# Patient Record
Sex: Male | Born: 1960 | Race: White | Hispanic: No | Smoking: Current every day smoker
Health system: Southern US, Community
[De-identification: ages and names within clinical notes are randomized; demographics above are authoritative.]

## PROBLEM LIST (undated history)

## (undated) DIAGNOSIS — J449 Chronic obstructive pulmonary disease, unspecified: Secondary | ICD-10-CM

## (undated) DIAGNOSIS — J189 Pneumonia, unspecified organism: Secondary | ICD-10-CM

---

## 1998-02-06 ENCOUNTER — Emergency Department (HOSPITAL_COMMUNITY): Admission: EM | Admit: 1998-02-06 | Discharge: 1998-02-06 | Payer: Self-pay | Admitting: Emergency Medicine

## 1999-01-14 ENCOUNTER — Emergency Department (HOSPITAL_COMMUNITY): Admission: EM | Admit: 1999-01-14 | Discharge: 1999-01-14 | Payer: Self-pay | Admitting: Emergency Medicine

## 2013-06-13 ENCOUNTER — Encounter (HOSPITAL_COMMUNITY): Payer: Self-pay | Admitting: Emergency Medicine

## 2013-06-13 ENCOUNTER — Emergency Department (INDEPENDENT_AMBULATORY_CARE_PROVIDER_SITE_OTHER): Payer: BC Managed Care – PPO

## 2013-06-13 ENCOUNTER — Emergency Department (INDEPENDENT_AMBULATORY_CARE_PROVIDER_SITE_OTHER)
Admission: EM | Admit: 2013-06-13 | Discharge: 2013-06-13 | Disposition: A | Discharge status: Home / Self Care | Payer: BC Managed Care – PPO | Source: Home / Self Care | Attending: Family Medicine | Admitting: Family Medicine

## 2013-06-13 DIAGNOSIS — J111 Influenza due to unidentified influenza virus with other respiratory manifestations: Secondary | ICD-10-CM

## 2013-06-13 HISTORY — DX: Chronic obstructive pulmonary disease, unspecified: J44.9

## 2013-06-13 HISTORY — DX: Pneumonia, unspecified organism: J18.9

## 2013-06-13 LAB — POCT I-STAT, CHEM 8
Calcium, Ion: 1.18 mmol/L (ref 1.12–1.23)
Chloride: 98 mEq/L (ref 96–112)
Glucose, Bld: 110 mg/dL — ABNORMAL HIGH (ref 70–99)
HCT: 48 % (ref 39.0–52.0)
Hemoglobin: 16.3 g/dL (ref 13.0–17.0)
TCO2: 28 mmol/L (ref 0–100)

## 2013-06-13 LAB — CBC
MCH: 31.2 pg (ref 26.0–34.0)
MCHC: 35.7 g/dL (ref 30.0–36.0)
Platelets: 189 10*3/uL (ref 150–400)
RBC: 5.23 MIL/uL (ref 4.22–5.81)
RDW: 12.9 % (ref 11.5–15.5)

## 2013-06-13 LAB — SEDIMENTATION RATE: Sed Rate: 3 mm/hr (ref 0–16)

## 2013-06-13 MED ORDER — IBUPROFEN 800 MG PO TABS
ORAL_TABLET | ORAL | Status: AC
  Filled 2013-06-13: qty 1

## 2013-06-13 MED ORDER — ONDANSETRON HCL 8 MG PO TABS: 8.0000 mg | ORAL_TABLET | Freq: Three times a day (TID) | ORAL | Status: DC | PRN

## 2013-06-13 MED ORDER — AZITHROMYCIN 250 MG PO TABS: 250.0000 mg | ORAL_TABLET | Freq: Every day | ORAL | Status: DC

## 2013-06-13 MED ORDER — IBUPROFEN 800 MG PO TABS
800.0000 mg | ORAL_TABLET | Freq: Once | ORAL | Status: AC
  Administered 2013-06-13: 800 mg via ORAL

## 2013-06-13 MED ORDER — ONDANSETRON 4 MG PO TBDP
ORAL_TABLET | ORAL | Status: AC
  Filled 2013-06-13: qty 2

## 2013-06-13 MED ORDER — ONDANSETRON 4 MG PO TBDP
8.0000 mg | ORAL_TABLET | Freq: Once | ORAL | Status: AC
  Administered 2013-06-13: 8 mg via ORAL

## 2013-06-13 MED ORDER — HYDROCODONE-ACETAMINOPHEN 5-325 MG PO TABS: 1.0000 | ORAL_TABLET | Freq: Four times a day (QID) | ORAL | Status: DC | PRN

## 2013-06-13 NOTE — ED Notes (Signed)
Started this morning with fever 101.5, swollen & painful bilat hands, knee pain, generalized aches, diarrhea, nausea, and slight HA.  Since pneumonia in Sept, has had "wet" cough, but yesterday cough completely went away.  Had very slight sore throat when woke up from sleeping today.  Has taken Advil - last dose @ 1400.

## 2013-06-13 NOTE — ED Provider Notes (Signed)
Clayton Wright is a 52 y.o. male who presents to Urgent Care today for cough congestion diarrhea body aches headache. This is been present for the last 2 days. However he developed a fever recently and has had bilateral hand pain. His hand pain is the most concerning symptom for the patient. He notes that with the diarrhea he has been urinating less today than normal. He has been trying to eat and drink normally. He denies any chest pain or trouble breathing.  Hand pain as moderate. He has tried over-the-counter pain medications which are been mildly helpful.   Past Medical History  Diagnosis Date  . COPD (chronic obstructive pulmonary disease)   . Pneumonia     02/2013   History  Substance Use Topics  . Smoking status: Current Every Day Smoker -- 1.00 packs/day    Types: Cigarettes  . Smokeless tobacco: Not on file  . Alcohol Use: No   ROS as above Medications reviewed. No current facility-administered medications for this encounter.   Current Outpatient Prescriptions  Medication Sig Dispense Refill  . azithromycin (ZITHROMAX) 250 MG tablet Take 1 tablet (250 mg total) by mouth daily. Take first 2 tablets together, then 1 every day until finished.  6 tablet  0  . HYDROcodone-acetaminophen (NORCO/VICODIN) 5-325 MG per tablet Take 1 tablet by mouth every 6 (six) hours as needed.  15 tablet  0  . ondansetron (ZOFRAN) 8 MG tablet Take 1 tablet (8 mg total) by mouth every 8 (eight) hours as needed for nausea or vomiting.  20 tablet  0    Exam:  BP 124/83  Pulse 117  Temp(Src) 101 F (38.3 C) (Oral)  Resp 20  SpO2 96% Gen: Well NAD HEENT: EOMI,  MMM Lungs: Normal work of breathing. Decreased breath sounds right lower lobe. Normal on left. Heart: RRR no MRG Abd: NABS, Soft. NT, ND Exts: Non edematous BL  LE, warm and well perfused.  Hands: Mildly tender MCPs bilaterally. No significant boggy synovitis palpated.  Results for orders placed during the hospital encounter of 06/13/13  (from the past 24 hour(s))  CBC     Status: Abnormal   Collection Time    06/13/13  6:47 PM      Result Value Range   WBC 14.7 (*) 4.0 - 10.5 K/uL   RBC 5.23  4.22 - 5.81 MIL/uL   Hemoglobin 16.3  13.0 - 17.0 g/dL   HCT 16.1  09.6 - 04.5 %   MCV 87.2  78.0 - 100.0 fL   MCH 31.2  26.0 - 34.0 pg   MCHC 35.7  30.0 - 36.0 g/dL   RDW 40.9  81.1 - 91.4 %   Platelets 189  150 - 400 K/uL  POCT I-STAT, CHEM 8     Status: Abnormal   Collection Time    06/13/13  7:11 PM      Result Value Range   Sodium 137  135 - 145 mEq/L   Potassium 3.8  3.5 - 5.1 mEq/L   Chloride 98  96 - 112 mEq/L   BUN 9  6 - 23 mg/dL   Creatinine, Ser 7.82  0.50 - 1.35 mg/dL   Glucose, Bld 956 (*) 70 - 99 mg/dL   Calcium, Ion 2.13  0.86 - 1.23 mmol/L   TCO2 28  0 - 100 mmol/L   Hemoglobin 16.3  13.0 - 17.0 g/dL   HCT 57.8  46.9 - 62.9 %   Dg Chest 2 View  06/13/2013  CLINICAL DATA:  Fever, body aches, dizziness  EXAM: CHEST - 2 VIEW  COMPARISON:  None available  FINDINGS: Lungs are clear. Heart size and mediastinal contours are within normal limits. Tortuous thoracic aorta. No effusion. Visualized skeletal structures are unremarkable.  IMPRESSION: No acute cardiopulmonary disease.   Electronically Signed   By: Oley Balm M.D.   On: 06/13/2013 19:20    Assessment and Plan: 52 y.o. male with probable influenza. Patient has fever myalgia and arthralgia. Additionally he has a slight headache. His chest x-ray is normal as are his labs with the exception of an elevated white count at 14.7. We discussed options. I offered transfer to the emergency room tonight for further evaluation and management and the patient declined. I do not feel that this is essential at this time. I feel that he is safe for watchful waiting tonight with oral hydration protocol, and small amount of Norco. Additionally we'll prescribe azithromycin for use if he's not improving. I recommend that he followup with his primary care provider on  Monday and or present to the emergency room if worsening. Discussed warning signs or symptoms. Please see discharge instructions. Patient expresses understanding.      Rodolph Bong, MD 06/13/13 [phone]

## 2020-08-29 ENCOUNTER — Emergency Department (HOSPITAL_COMMUNITY): Payer: BC Managed Care – PPO

## 2020-08-29 ENCOUNTER — Emergency Department (HOSPITAL_COMMUNITY): Payer: BC Managed Care – PPO | Admitting: Certified Registered Nurse Anesthetist

## 2020-08-29 ENCOUNTER — Encounter (HOSPITAL_COMMUNITY): Admission: EM | Disposition: E | Payer: Self-pay | Source: Home / Self Care | Attending: Vascular Surgery

## 2020-08-29 ENCOUNTER — Inpatient Hospital Stay (HOSPITAL_COMMUNITY)
Admission: EM | Admit: 2020-08-29 | Discharge: 2020-09-22 | DRG: 270 | Disposition: E | Payer: BC Managed Care – PPO | Attending: Vascular Surgery | Admitting: Vascular Surgery

## 2020-08-29 ENCOUNTER — Inpatient Hospital Stay (HOSPITAL_COMMUNITY): Payer: BC Managed Care – PPO

## 2020-08-29 DIAGNOSIS — D62 Acute posthemorrhagic anemia: Secondary | ICD-10-CM | POA: Diagnosis present

## 2020-08-29 DIAGNOSIS — J449 Chronic obstructive pulmonary disease, unspecified: Secondary | ICD-10-CM | POA: Diagnosis present

## 2020-08-29 DIAGNOSIS — R571 Hypovolemic shock: Secondary | ICD-10-CM | POA: Diagnosis present

## 2020-08-29 DIAGNOSIS — Z91030 Bee allergy status: Secondary | ICD-10-CM | POA: Diagnosis not present

## 2020-08-29 DIAGNOSIS — Z20822 Contact with and (suspected) exposure to covid-19: Secondary | ICD-10-CM | POA: Diagnosis present

## 2020-08-29 DIAGNOSIS — N179 Acute kidney failure, unspecified: Secondary | ICD-10-CM | POA: Diagnosis not present

## 2020-08-29 DIAGNOSIS — F17210 Nicotine dependence, cigarettes, uncomplicated: Secondary | ICD-10-CM | POA: Diagnosis present

## 2020-08-29 DIAGNOSIS — I713 Abdominal aortic aneurysm, ruptured, unspecified: Secondary | ICD-10-CM

## 2020-08-29 DIAGNOSIS — Z79899 Other long term (current) drug therapy: Secondary | ICD-10-CM | POA: Diagnosis not present

## 2020-08-29 DIAGNOSIS — Z515 Encounter for palliative care: Secondary | ICD-10-CM | POA: Diagnosis not present

## 2020-08-29 DIAGNOSIS — D72829 Elevated white blood cell count, unspecified: Secondary | ICD-10-CM

## 2020-08-29 DIAGNOSIS — R109 Unspecified abdominal pain: Secondary | ICD-10-CM | POA: Diagnosis present

## 2020-08-29 DIAGNOSIS — Z792 Long term (current) use of antibiotics: Secondary | ICD-10-CM

## 2020-08-29 DIAGNOSIS — E876 Hypokalemia: Secondary | ICD-10-CM

## 2020-08-29 DIAGNOSIS — I469 Cardiac arrest, cause unspecified: Secondary | ICD-10-CM | POA: Diagnosis not present

## 2020-08-29 DIAGNOSIS — J9600 Acute respiratory failure, unspecified whether with hypoxia or hypercapnia: Secondary | ICD-10-CM | POA: Diagnosis not present

## 2020-08-29 DIAGNOSIS — N2883 Nephroptosis: Secondary | ICD-10-CM | POA: Diagnosis not present

## 2020-08-29 DIAGNOSIS — R578 Other shock: Secondary | ICD-10-CM | POA: Diagnosis present

## 2020-08-29 DIAGNOSIS — Z419 Encounter for procedure for purposes other than remedying health state, unspecified: Secondary | ICD-10-CM

## 2020-08-29 DIAGNOSIS — Z978 Presence of other specified devices: Secondary | ICD-10-CM

## 2020-08-29 DIAGNOSIS — Z880 Allergy status to penicillin: Secondary | ICD-10-CM | POA: Diagnosis not present

## 2020-08-29 DIAGNOSIS — Z66 Do not resuscitate: Secondary | ICD-10-CM | POA: Diagnosis not present

## 2020-08-29 DIAGNOSIS — E872 Acidosis, unspecified: Secondary | ICD-10-CM | POA: Diagnosis present

## 2020-08-29 DIAGNOSIS — R739 Hyperglycemia, unspecified: Secondary | ICD-10-CM | POA: Diagnosis present

## 2020-08-29 DIAGNOSIS — Z6836 Body mass index (BMI) 36.0-36.9, adult: Secondary | ICD-10-CM | POA: Diagnosis not present

## 2020-08-29 DIAGNOSIS — I468 Cardiac arrest due to other underlying condition: Secondary | ICD-10-CM | POA: Diagnosis not present

## 2020-08-29 DIAGNOSIS — K661 Hemoperitoneum: Secondary | ICD-10-CM | POA: Diagnosis not present

## 2020-08-29 DIAGNOSIS — D65 Disseminated intravascular coagulation [defibrination syndrome]: Secondary | ICD-10-CM | POA: Diagnosis present

## 2020-08-29 HISTORY — PX: ABDOMINAL AORTIC ANEURYSM REPAIR: SHX42

## 2020-08-29 LAB — TYPE AND SCREEN: Unit division: 0

## 2020-08-29 LAB — BPAM RBC
Blood Product Expiration Date: 202204032359
Blood Product Expiration Date: 202204042359
Blood Product Expiration Date: 202204052359
Blood Product Expiration Date: 202204052359
ISSUE DATE / TIME: 202203081753
ISSUE DATE / TIME: 202203081858
Unit Type and Rh: 5100
Unit Type and Rh: 5100
Unit Type and Rh: 5100

## 2020-08-29 LAB — POCT I-STAT 7, (LYTES, BLD GAS, ICA,H+H)
Acid-base deficit: 10 mmol/L — ABNORMAL HIGH (ref 0.0–2.0)
Bicarbonate: 18.8 mmol/L — ABNORMAL LOW (ref 20.0–28.0)
Calcium, Ion: 1.21 mmol/L (ref 1.15–1.40)
HCT: 26 % — ABNORMAL LOW (ref 39.0–52.0)
Hemoglobin: 8.8 g/dL — ABNORMAL LOW (ref 13.0–17.0)
O2 Saturation: 100 %
Patient temperature: 34.6
Potassium: 5 mmol/L (ref 3.5–5.1)
Sodium: 137 mmol/L (ref 135–145)
TCO2: 21 mmol/L — ABNORMAL LOW (ref 22–32)
pCO2 arterial: 53.3 mmHg — ABNORMAL HIGH (ref 32.0–48.0)
pH, Arterial: 7.141 — CL (ref 7.350–7.450)
pO2, Arterial: 274 mmHg — ABNORMAL HIGH (ref 83.0–108.0)

## 2020-08-29 LAB — BASIC METABOLIC PANEL
Anion gap: 16 — ABNORMAL HIGH (ref 5–15)
BUN: 13 mg/dL (ref 6–20)
CO2: 15 mmol/L — ABNORMAL LOW (ref 22–32)
Calcium: 8.5 mg/dL — ABNORMAL LOW (ref 8.9–10.3)
Chloride: 105 mmol/L (ref 98–111)
Creatinine, Ser: 2.12 mg/dL — ABNORMAL HIGH (ref 0.61–1.24)
GFR, Estimated: 35 mL/min — ABNORMAL LOW (ref >60)
Glucose, Bld: 350 mg/dL — ABNORMAL HIGH (ref 70–99)
Potassium: 5.1 mmol/L (ref 3.5–5.1)
Sodium: 136 mmol/L (ref 135–145)

## 2020-08-29 LAB — CBC
HCT: 38.8 % — ABNORMAL LOW (ref 39.0–52.0)
HCT: 29.8 % — ABNORMAL LOW (ref 39.0–52.0)
Hemoglobin: 12.5 g/dL — ABNORMAL LOW (ref 13.0–17.0)
Hemoglobin: 9.6 g/dL — ABNORMAL LOW (ref 13.0–17.0)
MCH: 30.8 pg (ref 26.0–34.0)
MCH: 29.7 pg (ref 26.0–34.0)
MCHC: 32.2 g/dL (ref 30.0–36.0)
MCHC: 32.2 g/dL (ref 30.0–36.0)
MCV: 95.6 fL (ref 80.0–100.0)
MCV: 92.3 fL (ref 80.0–100.0)
Platelets: 188 10*3/uL (ref 150–400)
Platelets: 32 10*3/uL — ABNORMAL LOW (ref 150–400)
RBC: 4.06 MIL/uL — ABNORMAL LOW (ref 4.22–5.81)
RBC: 3.23 MIL/uL — ABNORMAL LOW (ref 4.22–5.81)
RDW: 13.1 % (ref 11.5–15.5)
RDW: 15.2 % (ref 11.5–15.5)
WBC: 17.9 10*3/uL — ABNORMAL HIGH (ref 4.0–10.5)
WBC: 24.1 10*3/uL — ABNORMAL HIGH (ref 4.0–10.5)
nRBC: 0 % (ref 0.0–0.2)
nRBC: 0.2 % (ref 0.0–0.2)

## 2020-08-29 LAB — I-STAT CHEM 8, ED
BUN: 13 mg/dL (ref 6–20)
Calcium, Ion: 1.07 mmol/L — ABNORMAL LOW (ref 1.15–1.40)
Chloride: 103 mmol/L (ref 98–111)
Creatinine, Ser: 1.6 mg/dL — ABNORMAL HIGH (ref 0.61–1.24)
Glucose, Bld: 206 mg/dL — ABNORMAL HIGH (ref 70–99)
HCT: 35 % — ABNORMAL LOW (ref 39.0–52.0)
Hemoglobin: 11.9 g/dL — ABNORMAL LOW (ref 13.0–17.0)
Potassium: 3 mmol/L — ABNORMAL LOW (ref 3.5–5.1)
Sodium: 139 mmol/L (ref 135–145)
TCO2: 17 mmol/L — ABNORMAL LOW (ref 22–32)

## 2020-08-29 LAB — COMPREHENSIVE METABOLIC PANEL
ALT: 19 U/L (ref 0–44)
AST: 25 U/L (ref 15–41)
Albumin: 3.6 g/dL (ref 3.5–5.0)
Alkaline Phosphatase: 55 U/L (ref 38–126)
Anion gap: 21 — ABNORMAL HIGH (ref 5–15)
BUN: 12 mg/dL (ref 6–20)
CO2: 15 mmol/L — ABNORMAL LOW (ref 22–32)
Calcium: 9.2 mg/dL (ref 8.9–10.3)
Chloride: 100 mmol/L (ref 98–111)
Creatinine, Ser: 1.73 mg/dL — ABNORMAL HIGH (ref 0.61–1.24)
GFR, Estimated: 45 mL/min — ABNORMAL LOW (ref >60)
Glucose, Bld: 224 mg/dL — ABNORMAL HIGH (ref 70–99)
Potassium: 2.9 mmol/L — ABNORMAL LOW (ref 3.5–5.1)
Sodium: 136 mmol/L (ref 135–145)
Total Bilirubin: 0.9 mg/dL (ref 0.3–1.2)
Total Protein: 6.2 g/dL — ABNORMAL LOW (ref 6.5–8.1)

## 2020-08-29 LAB — MAGNESIUM: Magnesium: 2.4 mg/dL (ref 1.7–2.4)

## 2020-08-29 LAB — SARS CORONAVIRUS 2 BY RT PCR (HOSPITAL ORDER, PERFORMED IN ~~LOC~~ HOSPITAL LAB): SARS Coronavirus 2: NEGATIVE

## 2020-08-29 LAB — MASSIVE TRANSFUSION PROTOCOL ORDER (BLOOD BANK NOTIFICATION)

## 2020-08-29 LAB — PROTIME-INR
INR: 1.2 (ref 0.8–1.2)
Prothrombin Time: 15 seconds (ref 11.4–15.2)

## 2020-08-29 LAB — APTT: aPTT: 34 seconds (ref 24–36)

## 2020-08-29 LAB — ABO/RH: ABO/RH(D): O POS

## 2020-08-29 LAB — LACTIC ACID, PLASMA: Lactic Acid, Venous: >11 mmol/L (ref 0.5–1.9)

## 2020-08-29 LAB — PREPARE RBC (CROSSMATCH)

## 2020-08-29 SURGERY — ANEURYSM ABDOMINAL AORTIC REPAIR
Anesthesia: General | Site: Abdomen

## 2020-08-29 MED ORDER — PROPOFOL 1000 MG/100ML IV EMUL
INTRAVENOUS | Status: AC
  Filled 2020-08-29: qty 100

## 2020-08-29 MED ORDER — METOPROLOL TARTRATE 5 MG/5ML IV SOLN: 2.0000 mg | INTRAVENOUS | Status: DC | PRN

## 2020-08-29 MED ORDER — EPINEPHRINE 1 MG/10ML IJ SOSY
PREFILLED_SYRINGE | INTRAMUSCULAR | Status: DC | PRN
  Administered 2020-08-29: 1 mg via INTRAVENOUS

## 2020-08-29 MED ORDER — ALBUMIN HUMAN 5 % IV SOLN: INTRAVENOUS | Status: DC | PRN

## 2020-08-29 MED ORDER — CALCIUM CHLORIDE 10 % IV SOLN
INTRAVENOUS | Status: DC | PRN
  Administered 2020-08-29 (×3): 1 g via INTRAVENOUS

## 2020-08-29 MED ORDER — PROPOFOL 1000 MG/100ML IV EMUL
5.0000 ug/kg/min | INTRAVENOUS | Status: DC
  Administered 2020-08-29: 20 ug/kg/min via INTRAVENOUS

## 2020-08-29 MED ORDER — PANTOPRAZOLE SODIUM 40 MG IV SOLR
40.0000 mg | Freq: Every day | INTRAVENOUS | Status: DC
  Administered 2020-08-30: 40 mg via INTRAVENOUS
  Filled 2020-08-29: qty 40

## 2020-08-29 MED ORDER — MIDAZOLAM HCL 2 MG/2ML IJ SOLN
INTRAMUSCULAR | Status: AC
  Filled 2020-08-29: qty 2

## 2020-08-29 MED ORDER — POLYETHYLENE GLYCOL 3350 17 G PO PACK: 17.0000 g | PACK | Freq: Every day | ORAL | Status: DC

## 2020-08-29 MED ORDER — HYDROMORPHONE HCL 1 MG/ML IJ SOLN
0.5000 mg | INTRAMUSCULAR | Status: DC | PRN
Start: 2020-08-29 — End: 2020-08-29

## 2020-08-29 MED ORDER — NOREPINEPHRINE 4 MG/250ML-% IV SOLN
INTRAVENOUS | Status: AC
  Administered 2020-08-30: 45 mg
  Filled 2020-08-29: qty 250

## 2020-08-29 MED ORDER — INSULIN ASPART 100 UNIT/ML ~~LOC~~ SOLN
SUBCUTANEOUS | Status: AC
  Filled 2020-08-29: qty 1

## 2020-08-29 MED ORDER — SODIUM BICARBONATE 8.4 % IV SOLN: 50.0000 meq | Freq: Once | INTRAVENOUS | Status: AC

## 2020-08-29 MED ORDER — 0.9 % SODIUM CHLORIDE (POUR BTL) OPTIME
TOPICAL | Status: DC | PRN
  Administered 2020-08-29: 2000 mL

## 2020-08-29 MED ORDER — FENTANYL CITRATE (PF) 100 MCG/2ML IJ SOLN
50.0000 ug | INTRAMUSCULAR | Status: DC | PRN
  Administered 2020-08-30 (×6): 100 ug via INTRAVENOUS
  Filled 2020-08-29 (×7): qty 2

## 2020-08-29 MED ORDER — SODIUM CHLORIDE 0.9 % IV SOLN: INTRAVENOUS | Status: DC | PRN

## 2020-08-29 MED ORDER — LORAZEPAM 2 MG/ML IJ SOLN
INTRAMUSCULAR | Status: AC
  Filled 2020-08-29: qty 1

## 2020-08-29 MED ORDER — LORAZEPAM 2 MG/ML IJ SOLN
2.0000 mg | Freq: Once | INTRAMUSCULAR | Status: AC
  Administered 2020-08-29: 2 mg via INTRAVENOUS

## 2020-08-29 MED ORDER — IOHEXOL 350 MG/ML SOLN
100.0000 mL | Freq: Once | INTRAVENOUS | Status: AC | PRN
  Administered 2020-08-29: 100 mL via INTRAVENOUS

## 2020-08-29 MED ORDER — MAGNESIUM SULFATE 2 GM/50ML IV SOLN: 2.0000 g | Freq: Once | INTRAVENOUS | Status: DC | PRN

## 2020-08-29 MED ORDER — SUCCINYLCHOLINE CHLORIDE 20 MG/ML IJ SOLN
INTRAMUSCULAR | Status: AC | PRN
  Administered 2020-08-29: 150 mg via INTRAVENOUS

## 2020-08-29 MED ORDER — FENTANYL CITRATE (PF) 250 MCG/5ML IJ SOLN
INTRAMUSCULAR | Status: DC | PRN
  Administered 2020-08-29 (×2): 100 ug via INTRAVENOUS

## 2020-08-29 MED ORDER — ALBUTEROL SULFATE HFA 108 (90 BASE) MCG/ACT IN AERS
INHALATION_SPRAY | RESPIRATORY_TRACT | Status: DC | PRN
  Administered 2020-08-29 (×2): 2 via RESPIRATORY_TRACT

## 2020-08-29 MED ORDER — VASOPRESSIN 20 UNITS/100 ML INFUSION FOR SHOCK
0.0000 [IU]/min | INTRAVENOUS | Status: DC
  Administered 2020-08-29: .03 [IU]/min via INTRAVENOUS
  Filled 2020-08-29: qty 100

## 2020-08-29 MED ORDER — VASOPRESSIN 20 UNITS/100 ML INFUSION FOR SHOCK
0.0000 [IU]/min | INTRAVENOUS | Status: DC
Start: 2020-08-30 — End: 2020-08-30
  Administered 2020-08-30: 0.05 [IU]/min via INTRAVENOUS
  Administered 2020-08-30: 0.04 [IU]/min via INTRAVENOUS
  Filled 2020-08-29 (×2): qty 100

## 2020-08-29 MED ORDER — VASOPRESSIN 20 UNIT/ML IV SOLN
INTRAVENOUS | Status: AC
  Filled 2020-08-29: qty 1

## 2020-08-29 MED ORDER — ETOMIDATE 2 MG/ML IV SOLN
INTRAVENOUS | Status: AC | PRN
  Administered 2020-08-29: 30 mg via INTRAVENOUS

## 2020-08-29 MED ORDER — VASOPRESSIN 20 UNIT/ML IV SOLN
INTRAVENOUS | Status: DC | PRN
  Administered 2020-08-29 (×7): 2 [IU] via INTRAVENOUS

## 2020-08-29 MED ORDER — FENTANYL CITRATE (PF) 250 MCG/5ML IJ SOLN
INTRAMUSCULAR | Status: AC
  Filled 2020-08-29: qty 5

## 2020-08-29 MED ORDER — SODIUM CHLORIDE 0.9 % IV SOLN
INTRAVENOUS | Status: AC
  Filled 2020-08-29: qty 1.2

## 2020-08-29 MED ORDER — CEFAZOLIN SODIUM-DEXTROSE 2-3 GM-%(50ML) IV SOLR
INTRAVENOUS | Status: DC | PRN
  Administered 2020-08-29: 2 g via INTRAVENOUS

## 2020-08-29 MED ORDER — VANCOMYCIN HCL 1000 MG/200ML IV SOLN
1000.0000 mg | Freq: Two times a day (BID) | INTRAVENOUS | Status: AC
  Administered 2020-08-30 (×2): 1000 mg via INTRAVENOUS
  Filled 2020-08-29 (×2): qty 200

## 2020-08-29 MED ORDER — ETOMIDATE 2 MG/ML IV SOLN
INTRAVENOUS | Status: AC
  Filled 2020-08-29: qty 10

## 2020-08-29 MED ORDER — TRANEXAMIC ACID-NACL 1000-0.7 MG/100ML-% IV SOLN
1000.0000 mg | Freq: Once | INTRAVENOUS | Status: AC
  Administered 2020-08-29: 1000 mg via INTRAVENOUS
  Filled 2020-08-29: qty 100

## 2020-08-29 MED ORDER — SODIUM BICARBONATE 8.4 % IV SOLN
INTRAVENOUS | Status: DC | PRN
  Administered 2020-08-29 (×3): 50 meq via INTRAVENOUS

## 2020-08-29 MED ORDER — SODIUM CHLORIDE 0.9 % IV SOLN: INTRAVENOUS | Status: DC

## 2020-08-29 MED ORDER — LABETALOL HCL 5 MG/ML IV SOLN: 10.0000 mg | INTRAVENOUS | Status: DC | PRN

## 2020-08-29 MED ORDER — ACETAMINOPHEN 325 MG PO TABS: 325.0000 mg | ORAL_TABLET | ORAL | Status: DC | PRN

## 2020-08-29 MED ORDER — LACTATED RINGERS IV SOLN: INTRAVENOUS | Status: DC | PRN

## 2020-08-29 MED ORDER — DOCUSATE SODIUM 50 MG/5ML PO LIQD: 100.0000 mg | Freq: Two times a day (BID) | ORAL | Status: DC

## 2020-08-29 MED ORDER — HYDRALAZINE HCL 20 MG/ML IJ SOLN: 5.0000 mg | INTRAMUSCULAR | Status: DC | PRN

## 2020-08-29 MED ORDER — ACETAMINOPHEN 325 MG RE SUPP: 325.0000 mg | RECTAL | Status: DC | PRN

## 2020-08-29 MED ORDER — SODIUM CHLORIDE 0.9 % IV SOLN
500.0000 mL | Freq: Once | INTRAVENOUS | Status: AC | PRN
  Administered 2020-08-30: 500 mL via INTRAVENOUS

## 2020-08-29 MED ORDER — PHENOL 1.4 % MT LIQD: 1.0000 | OROMUCOSAL | Status: DC | PRN

## 2020-08-29 MED ORDER — HYDROCORTISONE NA SUCCINATE PF 100 MG IJ SOLR
INTRAMUSCULAR | Status: DC | PRN
  Administered 2020-08-29: 250 mg via INTRAVENOUS

## 2020-08-29 MED ORDER — INSULIN ASPART 100 UNIT/ML ~~LOC~~ SOLN
SUBCUTANEOUS | Status: AC
  Administered 2020-08-30: 15 [IU] via SUBCUTANEOUS
  Filled 2020-08-29: qty 1

## 2020-08-29 MED ORDER — DOCUSATE SODIUM 100 MG PO CAPS: 100.0000 mg | ORAL_CAPSULE | Freq: Every day | ORAL | Status: DC

## 2020-08-29 MED ORDER — BISACODYL 10 MG RE SUPP: 10.0000 mg | Freq: Every day | RECTAL | Status: DC | PRN

## 2020-08-29 MED ORDER — ETOMIDATE 2 MG/ML IV SOLN: INTRAVENOUS | Status: AC | PRN

## 2020-08-29 MED ORDER — DEXTROSE 50 % IV SOLN
INTRAVENOUS | Status: AC
  Filled 2020-08-29: qty 50

## 2020-08-29 MED ORDER — ONDANSETRON HCL 4 MG/2ML IJ SOLN: 4.0000 mg | Freq: Four times a day (QID) | INTRAMUSCULAR | Status: DC | PRN

## 2020-08-29 MED ORDER — INSULIN ASPART 100 UNIT/ML ~~LOC~~ SOLN
0.0000 [IU] | SUBCUTANEOUS | Status: DC
  Administered 2020-08-30: 5 [IU] via SUBCUTANEOUS
  Administered 2020-08-30: 11 [IU] via SUBCUTANEOUS
  Administered 2020-08-30: 2 [IU] via SUBCUTANEOUS

## 2020-08-29 MED ORDER — SODIUM CHLORIDE 0.9% IV SOLUTION: Freq: Once | INTRAVENOUS | Status: AC

## 2020-08-29 MED ORDER — FENTANYL CITRATE (PF) 100 MCG/2ML IJ SOLN
50.0000 ug | INTRAMUSCULAR | Status: DC | PRN
  Administered 2020-08-30 (×2): 50 ug via INTRAVENOUS

## 2020-08-29 MED ORDER — INSULIN ASPART 100 UNIT/ML ~~LOC~~ SOLN
SUBCUTANEOUS | Status: DC | PRN
  Administered 2020-08-29: 7 [IU] via INTRAVENOUS
  Administered 2020-08-29: 5 [IU] via INTRAVENOUS

## 2020-08-29 MED ORDER — PROPOFOL 1000 MG/100ML IV EMUL
5.0000 ug/kg/min | INTRAVENOUS | Status: DC
  Filled 2020-08-29: qty 100

## 2020-08-29 MED ORDER — DEXTROSE 50 % IV SOLN
INTRAVENOUS | Status: DC | PRN
  Administered 2020-08-29 (×2): 12.5 g via INTRAVENOUS

## 2020-08-29 MED ORDER — CALCIUM CHLORIDE 10 % IV SOLN
INTRAVENOUS | Status: AC
  Filled 2020-08-29: qty 10

## 2020-08-29 MED ORDER — ALBUTEROL SULFATE HFA 108 (90 BASE) MCG/ACT IN AERS
INHALATION_SPRAY | RESPIRATORY_TRACT | Status: AC
  Filled 2020-08-29: qty 6.7

## 2020-08-29 MED ORDER — SODIUM BICARBONATE 8.4 % IV SOLN
INTRAVENOUS | Status: AC
  Administered 2020-08-29: 50 meq via INTRAVENOUS
  Filled 2020-08-29: qty 50

## 2020-08-29 MED ORDER — NOREPINEPHRINE 4 MG/250ML-% IV SOLN
0.0000 ug/min | INTRAVENOUS | Status: DC
  Administered 2020-08-29: 35 ug/min via INTRAVENOUS
  Administered 2020-08-29: 20 ug/min via INTRAVENOUS
  Administered 2020-08-30 (×2): 40 ug/min via INTRAVENOUS
  Filled 2020-08-29: qty 750

## 2020-08-29 MED ORDER — MIDAZOLAM HCL 2 MG/2ML IJ SOLN
INTRAMUSCULAR | Status: DC | PRN
  Administered 2020-08-29: 2 mg via INTRAVENOUS

## 2020-08-29 SURGICAL SUPPLY — 61 items
ADH SKN CLS APL DERMABOND .7 (GAUZE/BANDAGES/DRESSINGS) ×1
CANISTER SUCT 3000ML PPV (MISCELLANEOUS) ×2 IMPLANT
CANNULA VESSEL 3MM 2 BLNT TIP (CANNULA) ×4 IMPLANT
CLIP VESOCCLUDE MED 24/CT (CLIP) ×2 IMPLANT
CLIP VESOCCLUDE SM WIDE 24/CT (CLIP) ×2 IMPLANT
COVER WAND RF STERILE (DRAPES) ×2 IMPLANT
DERMABOND ADVANCED (GAUZE/BANDAGES/DRESSINGS) ×1
DERMABOND ADVANCED .7 DNX12 (GAUZE/BANDAGES/DRESSINGS) ×1 IMPLANT
DRSG VAC ATS LRG SENSATRAC (GAUZE/BANDAGES/DRESSINGS) ×2 IMPLANT
ELECT BLADE 4.0 EZ CLEAN MEGAD (MISCELLANEOUS) ×2
ELECT BLADE 6.5 EXT (BLADE) IMPLANT
ELECT REM PT RETURN 9FT ADLT (ELECTROSURGICAL) ×2
ELECTRODE BLDE 4.0 EZ CLN MEGD (MISCELLANEOUS) ×1 IMPLANT
ELECTRODE REM PT RTRN 9FT ADLT (ELECTROSURGICAL) ×1 IMPLANT
FELT TEFLON 1X6 (MISCELLANEOUS) ×2 IMPLANT
GLOVE BIO SURGEON STRL SZ7.5 (GLOVE) ×2 IMPLANT
GLOVE SRG 8 PF TXTR STRL LF DI (GLOVE) ×3 IMPLANT
GLOVE SS BIOGEL STRL SZ 7.5 (GLOVE) ×1 IMPLANT
GLOVE SUPERSENSE BIOGEL SZ 7.5 (GLOVE) ×1
GLOVE SURG LTX SZ7.5 (GLOVE) ×2 IMPLANT
GLOVE SURG UNDER POLY LF SZ6.5 (GLOVE) ×12 IMPLANT
GLOVE SURG UNDER POLY LF SZ8 (GLOVE) ×6
GOWN STRL REUS W/ TWL LRG LVL3 (GOWN DISPOSABLE) ×4 IMPLANT
GOWN STRL REUS W/TWL LRG LVL3 (GOWN DISPOSABLE) ×8
GRAFT HEMASHIELD 16X8MM (Vascular Products) ×2 IMPLANT
HEMOSTAT SNOW SURGICEL 2X4 (HEMOSTASIS) ×4 IMPLANT
INSERT FOGARTY 61MM (MISCELLANEOUS) ×6 IMPLANT
INSERT FOGARTY SM (MISCELLANEOUS) ×10 IMPLANT
INSERT FOGARTY XLG (MISCELLANEOUS) ×2 IMPLANT
KIT BASIN OR (CUSTOM PROCEDURE TRAY) ×2 IMPLANT
KIT TURNOVER KIT B (KITS) ×2 IMPLANT
NS IRRIG 1000ML POUR BTL (IV SOLUTION) ×4 IMPLANT
PACK AORTA (CUSTOM PROCEDURE TRAY) ×2 IMPLANT
PAD ARMBOARD 7.5X6 YLW CONV (MISCELLANEOUS) ×4 IMPLANT
RETAINER VISCERA MED (MISCELLANEOUS) ×2 IMPLANT
SPONGE LAP 18X18 X RAY DECT (DISPOSABLE) ×4 IMPLANT
SPONGE SURGIFOAM ABS GEL 100 (HEMOSTASIS) IMPLANT
STAPLER VISISTAT (STAPLE) ×4 IMPLANT
SUT ETHIBOND 5 LR DA (SUTURE) IMPLANT
SUT PDS AB 1 TP1 54 (SUTURE) ×4 IMPLANT
SUT PROLENE 3 0 SH 1 (SUTURE) ×6 IMPLANT
SUT PROLENE 3 0 SH 48 (SUTURE) ×4 IMPLANT
SUT PROLENE 3 0 SH DA (SUTURE) IMPLANT
SUT PROLENE 3 0 SH1 36 (SUTURE) ×4 IMPLANT
SUT PROLENE 4 0 RB 1 (SUTURE) ×14
SUT PROLENE 4 0 SH DA (SUTURE) ×2 IMPLANT
SUT PROLENE 4-0 RB1 .5 CRCL 36 (SUTURE) ×3 IMPLANT
SUT PROLENE 4-0 RB1 18X2 ARM (SUTURE) ×4 IMPLANT
SUT PROLENE 5 0 C 1 24 (SUTURE) ×2 IMPLANT
SUT PROLENE 5 0 C 1 36 (SUTURE) ×2 IMPLANT
SUT PROLENE 6 0 BV (SUTURE) ×2 IMPLANT
SUT PROLENE 6 0 C 1 30 (SUTURE) ×2 IMPLANT
SUT SILK 2 0 SH CR/8 (SUTURE) ×2 IMPLANT
SUT SILK 2 0SH CR/8 30 (SUTURE) ×2 IMPLANT
SUT VIC AB 2-0 CTB1 (SUTURE) ×8 IMPLANT
SUT VIC AB 3-0 SH 27 (SUTURE) ×4
SUT VIC AB 3-0 SH 27X BRD (SUTURE) ×2 IMPLANT
TOWEL GREEN STERILE (TOWEL DISPOSABLE) ×2 IMPLANT
TOWEL SURG RFD BLUE STRL DISP (DISPOSABLE) ×4 IMPLANT
TRAY FOLEY MTR SLVR 16FR STAT (SET/KITS/TRAYS/PACK) ×2 IMPLANT
WATER STERILE IRR 1000ML POUR (IV SOLUTION) ×4 IMPLANT

## 2020-08-29 NOTE — Transfer of Care (Signed)
Immediate Anesthesia Transfer of Care Note  Patient: Clayton Wright  Procedure(s) Performed: ANEURYSM ABDOMINAL AORTIC REPAIR (N/A Abdomen)  Patient Location: PACU and SICU  Anesthesia Type:General  Level of Consciousness: Patient remains intubated per anesthesia plan  Airway & Oxygen Therapy: Patient remains intubated per anesthesia plan and Patient placed on Ventilator (see vital sign flow sheet for setting)  Post-op Assessment: Report given to RN and Post -op Vital signs reviewed and stable  Post vital signs: Reviewed and stable  Last Vitals:  Vitals Value Taken Time  BP    Temp    Pulse 111 09/05/2020 2245  Resp 14 09/21/2020 2252  SpO2 100 % 09/14/2020 2245  Vitals shown include unvalidated device data.  Last Pain: There were no vitals filed for this visit.       Complications: No complications documented.

## 2020-08-29 NOTE — ED Notes (Signed)
Blood Bank (MTP Order) called @ 1814-per Asher Muir, RN called by Marylene Land

## 2020-08-29 NOTE — ED Notes (Signed)
MTP called by MD Messick for BP 50's systolic and possible dissection.

## 2020-08-29 NOTE — Anesthesia Preprocedure Evaluation (Addendum)
Anesthesia Evaluation  Patient identified by MRN, date of birth, ID bandPreop documentation limited or incomplete due to emergent nature of procedure.  Airway Mallampati: Intubated       Dental   Pulmonary Current Smoker,           Cardiovascular  Rhythm:Regular Rate:Tachycardia  Ruptured AAA   Neuro/Psych    GI/Hepatic   Endo/Other    Renal/GU      Musculoskeletal   Abdominal   Peds  Hematology   Anesthesia Other Findings   Reproductive/Obstetrics                            Anesthesia Physical Anesthesia Plan  ASA: V and emergent  Anesthesia Plan: General   Post-op Pain Management:    Induction: Intravenous  PONV Risk Score and Plan: 1 and Treatment may vary due to age or medical condition  Airway Management Planned: Oral ETT  Additional Equipment: Arterial line, CVP and Ultrasound Guidance Line Placement  Intra-op Plan:   Post-operative Plan: Post-operative intubation/ventilation  Informed Consent:     History available from chart only and Only emergency history available  Plan Discussed with:   Anesthesia Plan Comments: (Lab Results      Component                Value               Date                      WBC                      17.9 (H)            2020/09/01                HGB                      12.5 (L)            09/01/20                HCT                      38.8 (L)            2020-09-01                MCV                      95.6                01-Sep-2020                PLT                      188                 2020-09-01           Lab Results      Component                Value               Date                      NA  136                 09/15/2020                K                        2.9 (L)             09/09/2020                CO2                      15 (L)              09/18/2020                GLUCOSE                   224 (H)             08/22/2020                BUN                      12                  09/18/2020                CREATININE               1.73 (H)            08/31/2020                CALCIUM                  9.2                 08/25/2020                GFRNONAA                 45 (L)              09/12/2020          )        Anesthesia Quick Evaluation

## 2020-08-29 NOTE — Anesthesia Procedure Notes (Signed)
Central Venous Catheter Insertion Performed by: Darral Dash, DO, anesthesiologist Start/End03/08/2020 7:30 PM, 08/25/2020 7:45 PM Patient location: OR. Emergency situation Preanesthetic checklist: patient identified, IV checked, site marked and monitors and equipment checked Position: supine Lidocaine 1% used for infiltration and patient sedated Hand hygiene performed  and maximum sterile barriers used  Catheter size: 9 Fr MAC introducer Procedure performed using ultrasound guided technique. Ultrasound Notes:anatomy identified, needle tip was noted to be adjacent to the nerve/plexus identified and image(s) printed for medical record Attempts: 2 Post procedure complications: local hematoma. Additional procedure comments: Unsuccessful CVL left IJ X2 attempts in emergent situation. Unable to advance catheter over wire despite multiple maneuvers. Groin triple lumen CVL from ER to be utilized. Marland Kitchen

## 2020-08-29 NOTE — ED Notes (Signed)
Pt rushed to OR after CT Angio Abd/Pel with +dissection. Report given to OR team and CRNA.

## 2020-08-29 NOTE — Anesthesia Procedure Notes (Signed)
Arterial Line Insertion Start/End03/14/2022 7:15 PM, 08/29/2020 7:19 PM Performed by: Atilano Median, DO, anesthesiologist  Patient location: OR. Preanesthetic checklist: patient identified, IV checked and monitors and equipment checked Emergency situation Lidocaine 1% used for infiltration Left, ulnar was placed Catheter size: 20 Fr Hand hygiene performed , maximum sterile barriers used  and Seldinger technique used  Attempts: 1 Procedure performed using ultrasound guided technique. Following insertion, dressing applied and Biopatch. Post procedure assessment: normal and unchanged

## 2020-08-29 NOTE — Anesthesia Procedure Notes (Signed)
Central Venous Catheter Insertion Performed by: Leonides Grills, MD, anesthesiologist Start/End03/27/2022 9:40 PM, 08/29/2020 9:55 PM Patient location: OR. Preanesthetic checklist: patient identified, IV checked, site marked, risks and benefits discussed, surgical consent, monitors and equipment checked, pre-op evaluation, timeout performed and anesthesia consent Position: supine Patient sedated Hand hygiene performed , maximum sterile barriers used  and Seldinger technique used Catheter size: 8.5 Fr Total catheter length 10. Central line was placed.Sheath introducer Procedure performed using ultrasound guided technique. Ultrasound Notes:anatomy identified, needle tip was noted to be adjacent to the nerve/plexus identified, no ultrasound evidence of intravascular and/or intraneural injection and image(s) printed for medical record Attempts: 1 Following insertion, line sutured, dressing applied and Biopatch. Post procedure assessment: blood return through all ports, free fluid flow and no air  Patient tolerated the procedure well with no immediate complications.

## 2020-08-29 NOTE — ED Provider Notes (Signed)
[facility] EMERGENCY DEPARTMENT Provider Note   CSN: [id number] Arrival date & time: 08/27/2020  1728     History No chief complaint on file.   Clayton Wright is a 60 y.o. male.  60 year old male with prior medical history as detailed below presents for evaluation.  Patient arrives by EMS.  Patient was walking and then collapsed.  Patient apparently was initially responsive with EMS.  He then became unresponsive.  Patient is being bagged on arrival.  Patient without IV access.  Patient is intermittently conscious in the first several minutes of resuscitation.  He admits to abdominal pain.  He is otherwise unable to provide significant history.  Level 5 caveat.  Patient in extremis.  Patient intubated shortly after arrival.  Emergent vascular access obtained.  Bedside ED ultrasound demonstrated large AAA.  Resuscitation continued in the ED recess room.  Fluids, Levophed, and blood products transfused.  Dr. Edilia Bo of vascular is aware of case and patient emergently transported to CT.  From a CT patient was then transported emergently to the OR for leaking AAA.  The history is provided by the patient, medical records and the EMS personnel.  Illness Location:  Syncope, unresponsive, abdominal pain Severity:  Severe Onset quality:  Unable to specify Timing:  Unable to specify Progression:  Unable to specify Chronicity:  New      Past Medical History:  Diagnosis Date  . COPD (chronic obstructive pulmonary disease) (HCC)   . Pneumonia    02/2013    There are no problems to display for this patient.   No past surgical history on file.     No family history on file.  Social History   Tobacco Use  . Smoking status: Current Every Day Smoker    Packs/day: 1.00    Types: Cigarettes  Substance Use Topics  . Alcohol use: No  . Drug use: No    Home Medications Prior to Admission medications   Medication Sig Start Date End Date Taking? Authorizing  Provider  azithromycin (ZITHROMAX) 250 MG tablet Take 1 tablet (250 mg total) by mouth daily. Take first 2 tablets together, then 1 every day until finished. 06/13/13   Rodolph Bong, MD  HYDROcodone-acetaminophen (NORCO/VICODIN) 5-325 MG per tablet Take 1 tablet by mouth every 6 (six) hours as needed. 06/13/13   Rodolph Bong, MD  ondansetron (ZOFRAN) 8 MG tablet Take 1 tablet (8 mg total) by mouth every 8 (eight) hours as needed for nausea or vomiting. 06/13/13   Rodolph Bong, MD    Allergies    Amoxicillin and Bee venom  Review of Systems   Review of Systems  Unable to perform ROS: Acuity of condition    Physical Exam Updated Vital Signs BP (!) 51/29   Pulse (!) 102   Temp 99 F (37.2 C)   Resp (!) 30   SpO2 96%   Physical Exam Vitals and nursing note reviewed.  Constitutional:      General: He is in acute distress.     Appearance: He is well-developed and well-nourished. He is ill-appearing.     Comments: In extremis, ventilations assisted with BVM  Patient is able to provide minimal history.  He appears to be semiresponsive.  Cool, mottled skin      HENT:     Head: Normocephalic and atraumatic.     Mouth/Throat:     Mouth: Oropharynx is clear and moist.  Eyes:     Extraocular Movements: EOM normal.  Conjunctiva/sclera: Conjunctivae normal.     Pupils: Pupils are equal, round, and reactive to light.  Cardiovascular:     Rate and Rhythm: Regular rhythm. Tachycardia present.     Heart sounds: Normal heart sounds.  Pulmonary:     Effort: Pulmonary effort is normal. No respiratory distress.     Breath sounds: Normal breath sounds.  Abdominal:     General: There is no distension.     Palpations: Abdomen is soft.     Tenderness: There is no abdominal tenderness.  Musculoskeletal:        General: No deformity or edema. Normal range of motion.     Cervical back: Normal range of motion and neck supple.  Skin:    General: Skin is warm and dry.  Neurological:      General: No focal deficit present.     Comments: Semiresponsive, minimal ability to answer any questions, moves all 4 extremities  Psychiatric:        Mood and Affect: Mood and affect normal.     ED Results / Procedures / Treatments   Labs (all labs ordered are listed, but only abnormal results are displayed) Labs Reviewed  CBC - Abnormal; Notable for the following components:      Result Value   WBC 17.9 (*)    RBC 4.06 (*)    Hemoglobin 12.5 (*)    HCT 38.8 (*)    All other components within normal limits  COMPREHENSIVE METABOLIC PANEL - Abnormal; Notable for the following components:   Potassium 2.9 (*)    CO2 15 (*)    Glucose, Bld 224 (*)    Creatinine, Ser 1.73 (*)    Total Protein 6.2 (*)    GFR, Estimated 45 (*)    Anion gap 21 (*)    All other components within normal limits  LACTIC ACID, PLASMA - Abnormal; Notable for the following components:   Lactic Acid, Venous >11.0 (*)    All other components within normal limits  I-STAT CHEM 8, ED - Abnormal; Notable for the following components:   Potassium 3.0 (*)    Creatinine, Ser 1.60 (*)    Glucose, Bld 206 (*)    Calcium, Ion 1.07 (*)    TCO2 17 (*)    Hemoglobin 11.9 (*)    HCT 35.0 (*)    All other components within normal limits  PROTIME-INR  APTT  LACTIC ACID, PLASMA  DIC (DISSEMINATED INTRAVASCULAR COAGULATION)PANEL  DIC (DISSEMINATED INTRAVASCULAR COAGULATION)PANEL  DIC (DISSEMINATED INTRAVASCULAR COAGULATION)PANEL  DIC (DISSEMINATED INTRAVASCULAR COAGULATION)PANEL  DIC (DISSEMINATED INTRAVASCULAR COAGULATION)PANEL  HEMOGLOBIN AND HEMATOCRIT, BLOOD  HEMOGLOBIN AND HEMATOCRIT, BLOOD  HEMOGLOBIN AND HEMATOCRIT, BLOOD  HEMOGLOBIN AND HEMATOCRIT, BLOOD  HEMOGLOBIN AND HEMATOCRIT, BLOOD  I-STAT ARTERIAL BLOOD GAS, ED  TYPE AND SCREEN  ABO/RH  MASSIVE TRANSFUSION PROTOCOL ORDER (BLOOD BANK NOTIFICATION)  PREPARE FRESH FROZEN PLASMA  PREPARE PLATELET PHERESIS  PREPARE CRYOPRECIPITATE  MASSIVE  TRANSFUSION PROTOCOL ORDER (BLOOD BANK NOTIFICATION)    EKG None  Radiology CT Angio Abd/Pel w/ and/or w/o  Result Date: 09/01/2020 CLINICAL DATA:  Unresponsive, abdominal pain EXAM: CTA ABDOMEN AND PELVIS WITHOUT AND WITH CONTRAST TECHNIQUE: Multidetector CT imaging of the abdomen and pelvis was performed using the standard protocol during bolus administration of intravenous contrast. Multiplanar reconstructed images and MIPs were obtained and reviewed to evaluate the vascular anatomy. CONTRAST:  OMNIPAQUE IOHEXOL 350 MG/ML SOLN COMPARISON:  None. FINDINGS: VASCULAR Aorta: There is a 7 cm ruptured infrarenal abdominal aortic aneurysm, with site  of rupture identified in the right posterolateral aorta image 102/5. Large associated retroperitoneal hematoma. Extensive mural thrombus within the aortic aneurysm. Celiac: Patent without evidence of aneurysm, dissection, vasculitis or significant stenosis. SMA: Patent without evidence of aneurysm, dissection, vasculitis or significant stenosis. Renals: Both renal arteries are patent without evidence of aneurysm, dissection, vasculitis, fibromuscular dysplasia or significant stenosis. IMA: Not identified. Inflow: Incompletely evaluated due to extravasation of contrast from ruptured infrarenal abdominal aortic aneurysm. Minimal contrast opacification identified with no evidence of occlusion. Proximal Outflow: Limited evaluation due to incomplete contrast opacification, with no gross abnormalities. Veins: Right femoral catheter, tip extending to the right external iliac vein. Remaining venous structures are incompletely evaluated due to the arterial phase of contrast enhancement. Review of the MIP images confirms the above findings. NON-VASCULAR Lower chest: Hypoventilatory changes at the lung bases. No acute pleural or parenchymal lung disease. Hepatobiliary: No focal liver abnormality is seen. No gallstones, gallbladder wall thickening, or biliary dilatation.  Pancreas: Unremarkable. No pancreatic ductal dilatation or surrounding inflammatory changes. Spleen: Normal in size without focal abnormality. Adrenals/Urinary Tract: No gross renal abnormalities. There is displacement of the right kidney anteriorly by the large right retroperitoneal hematoma from the ruptured infrarenal abdominal aortic aneurysm. Bilateral adrenal gland thickening with low attenuation consistent with adenomas or hyperplasia. Bladder is decompressed with a Foley catheter. Stomach/Bowel: No bowel obstruction or ileus. No bowel wall thickening or inflammatory change. Lymphatic: No pathologic adenopathy. Reproductive: Prostate is unremarkable. Other: There is a large right retroperitoneal hematoma, measuring up to 8.0 x 12.2 cm in transverse direction, and extending approximately 20 cm in craniocaudal length. Trace free fluid within the peritoneal cavity. No free gas. No abdominal wall hernia. Musculoskeletal: No acute or destructive bony lesions. Reconstructed images demonstrate no additional findings. IMPRESSION: VASCULAR 1. Ruptured 7 cm infrarenal abdominal aortic aneurysm, with a rent identified in the right posterolateral aspect of the infrarenal aorta as above. Large retroperitoneal hematoma, right greater than left, with displacement of the right kidney as result. NON-VASCULAR 1. Large retroperitoneal hematoma as above. 2. Trace free fluid within the pelvis. Critical Value/emergent results were called by telephone at the time of interpretation on 09/03/2020 at 7:09 pm to [facility] , who verbally acknowledged these results. Electronically Signed   By: Sharlet SalinaMichael  Brown M.D.   On: 2021/01/18 19:15    Procedures Procedure Name: Intubation Date/Time: 09/04/2020 7:49 PM Performed by: Wynetta FinesMessick, Analyce Tavares C, MD Pre-anesthesia Checklist: Patient identified, Patient being monitored, Emergency Drugs available, Timeout performed and Suction available Oxygen Delivery Method: Non-rebreather  mask Preoxygenation: Pre-oxygenation with 100% oxygen Induction Type: Rapid sequence Ventilation: Mask ventilation without difficulty Grade View: Grade I Tube size: 8.0 mm Number of attempts: 1 Placement Confirmation: ETT inserted through vocal cords under direct vision,  CO2 detector and Breath sounds checked- equal and bilateral Tube secured with: ETT holder    .Central Line  Date/Time: 08/26/2020 7:49 PM Performed by: Wynetta FinesMessick, Sayana Salley C, MD Authorized by: Wynetta FinesMessick, Zacharey Jensen C, MD   Consent:    Consent obtained:  Emergent situation Pre-procedure details:    Indication(s): central venous access and insufficient peripheral access   Procedure details:    Location:  R femoral   Site selection rationale:  Emergent    Procedural supplies:  Triple lumen   Landmarks identified: yes     Ultrasound guidance: no     Number of attempts:  1   Successful placement: yes   Post-procedure details:    Post-procedure:  Line sutured and dressing applied  Assessment:  Blood return through all ports and free fluid flow   Procedure completion:  Tolerated    CRITICAL CARE Performed by: Wynetta Fines   Total critical care time: 80 minutes  Critical care time was exclusive of separately billable procedures and treating other patients.  Critical care was necessary to treat or prevent imminent or life-threatening deterioration.  Critical care was time spent personally by me on the following activities: development of treatment plan with patient and/or surrogate as well as nursing, discussions with consultants, evaluation of patient's response to treatment, examination of patient, obtaining history from patient or surrogate, ordering and performing treatments and interventions, ordering and review of laboratory studies, ordering and review of radiographic studies, pulse oximetry and re-evaluation of patient's condition.    Medications Ordered in ED Medications  propofol (DIPRIVAN) 1000 MG/100ML  infusion (has no administration in time range)  propofol (DIPRIVAN) 1000 MG/100ML infusion (20 mcg/kg/min Intravenous New Bag/Given 09/03/2020 1744)  norepinephrine (LEVOPHED) 4-5 MG/250ML-% infusion SOLN (has no administration in time range)  norepinephrine (LEVOPHED) 4mg  in premix infusion (20 mcg/min Intravenous New Bag/Given 08/25/2020 1752)  etomidate (AMIDATE) injection (30 mg Intravenous Given 08/28/2020 1729)  succinylcholine (ANECTINE) injection (150 mg Intravenous Given 08/24/2020 1730)  LORazepam (ATIVAN) 2 MG/ML injection (has no administration in time range)  etomidate (AMIDATE) injection (30 mg Intravenous Not Given 09/12/2020 1719)  iohexol (OMNIPAQUE) 350 MG/ML injection 100 mL (100 mLs Intravenous Contrast Given 09/07/2020 1833)    ED Course  I have reviewed the triage vital signs and the nursing notes.  Pertinent labs & imaging results that were available during my care of the patient were reviewed by me and considered in my medical decision making (see chart for details).    MDM Rules/Calculators/A&P                          MDM  Screen complete  Clayton Wright was evaluated in Emergency Department on 09/02/2020 for the symptoms described in the history of present illness. He was evaluated in the context of the global COVID-19 pandemic, which necessitated consideration that the patient might be at risk for infection with the SARS-CoV-2 virus that causes COVID-19. Institutional protocols and algorithms that pertain to the evaluation of patients at risk for COVID-19 are in a state of rapid change based on information released by regulatory bodies including the CDC and federal and state organizations. These policies and algorithms were followed during the patient's care in the ED.   Patient arrived in extremis after collapse.  Patient with evidence of hemodynamic collapse on initial evaluation.  Patient intubated on arrival.  Emergent IV access obtained.   Bedside ED ultrasound  demonstrated large AAA.  Dr. 10/29/2020 of Vascular aware of case. Dr. Edilia Bo to ED for direct patient evaluation.   Aggressive resuscitation initiated in ED.  Patient transported emergently to CT and then directly to the OR for treatment of ruptured AAA.  Patient's family -wife -updated regarding the critical nature of her husband's disease.  She consents to all treatment.  She understands that her husband is critically ill.   Final Clinical Impression(s) / ED Diagnoses Final diagnoses:  Ruptured abdominal aortic aneurysm (AAA) [facility])    Rx / DC Orders ED Discharge Orders    None       [facility], MD 08/28/2020 [phone]

## 2020-08-29 NOTE — Op Note (Signed)
NAME: Clayton Wright    MRN: [id number] DOB: 1960/07/08    DATE OF OPERATION: 09/20/2020  PREOP DIAGNOSIS:    Ruptured abdominal aortic aneurysm  POSTOP DIAGNOSIS:    Same  PROCEDURE:    Open repair of ruptured abdominal aortic aneurysm with aortobiiliac bypass graft (16 x 8 Dacron graft) Placement of abdominal VAC  SURGEON: Di Kindle. Edilia Bo, MD  ASSIST: Darlin Coco, PA  ANESTHESIA: General  EBL: 2800 cc.  1100 of Cell Saver was returned  INDICATIONS:    Clayton Wright is a 60 y.o. male who had a syncopal episode earlier today while walking.  He presented to the emergency department and was unresponsive with a blood pressure of 60.  He was intubated and resuscitated.  Abdominal ultrasound suggested an abdominal aortic aneurysm.  He was taken for stat CT of the abdomen pelvis which confirmed a very large ruptured abdominal aortic aneurysm.  He was taken urgently to the operating room for elective repair.  Based on my review of the CT scan there was not an adequate neck for an endovascular aneurysm repair and therefore he will underwent open repair.  In addition he was unstable and when he arrived in the room soon after we had to start compressions.   FINDINGS:   The aneurysm had ruptured posteriorly with a large retroperitoneal hematoma.  There was free intraperitoneal blood upon entering the abdomen.  TECHNIQUE:   Cath the patient was taken urgently to the operating room from the CT scanner.  His blood pressure remained low and while lines were being placed by anesthesia we were unable to obtain a pulse and started compressions.  I therefore felt the only option was to rapidly prepped the abdomen and groins and try to get a clamp on the infrarenal aorta.  The abdomen was prepped and draped in usual sterile fashion.  Anesthesia was able to obtain an A-line pressure was in the 60s.  The abdomen was quickly entered through a midline incision.  The transverse colon was  reflected superiorly and the small bowel reflected to the right.  There was a large retroperitoneal hematoma there was also free intraperitoneal blood throughout the abdomen.  I dissected up to the level of the renal vein which was quite deep as the patient was morbidly obese.  The aneurysm extended up to the renal arteries but I could using digital dissection identify the neck of the aneurysm just below the renals.  Once I had control of this with digital dissection I was able to place a large aortic DeBakey clamp proximally and the infrarenal aorta was clamped.  His pressure improved at this point.  The dissection was continued distally down to the common iliac arteries which I was able to identify and clamp also.  The patient subsequently received 2000 units of IV heparin.  The aneurysm was then entered.  Several lumbar arteries were sewn with 2-0 silk sutures.  I then teed off the proximal aneurysm.  I selected a 16 x 8 graft.  The graft cut the appropriate length and then sewn into into the infrarenal aorta with a felt cuff using a running 3-0 Prolene suture.  The proximal anastomosis was tested and was hemostatic.  Attention was then turned to the left iliac anastomosis.  I dissected down onto the left common iliac artery to where I could find a reasonable area to sew.  The left limb of the graft cut with appropriate length and sewn into into the left  common iliac artery using continuous 5-0 Prolene suture.  Prior to completing anastomosis the arteries were backbled and flushed appropriately and the anastomosis completed.  The right limb of the graft had been clamped.  We reestablished flow to the left leg and the patient was resuscitated to address the hypotension that occurred with opening of the left leg.  Compression of the graft was used to assist in this.  Attention was then turned to the right iliac anastomosis.  The dissection was carried back on the right common iliac artery to where it was  reasonable to sew.  The right limb of the graft cut with appropriate length slightly spatulated and sewn into into the right common iliac artery using continuous 4-0 Prolene suture.  Prior to completing this anastomosis the arteries were backbled and flushed appropriately and the anastomosis completed.  Flow was reestablished to the right leg.  This resulted in some drop in blood pressure and the patient was resuscitated and digital compression of the graft was used to assist with this resuscitation.  Patient was at this point significantly coagulopathic.  There was significant venous oozing from the area of the rupture posteriorly.  Small branches which normally would not be bleeding or oozing from everywhere.  Hemostasis was obtained with electrocautery as best I could.  Anesthesia aggressively continue to correct coagulation factors.  Urine output was marginal.  I do feel confident that this was an infrarenal clamp however.  At this point I felt the best option was to place an abdominal VAC and continue with aggressive resuscitation and correction of the coagulopathy.  The abdominal VAC was placed without difficulty.  At this point there was a brisk posterior tibial and dorsalis pedis signal with a Doppler bilaterally.  The patient will be transferred to the critical care unit in critical condition.  An abdominal x-ray was obtained as the counts were not obtained given the urgency of the case.  Given the complexity of the case a first assistant was necessary in order to expedient the procedure and safely perform the technical aspects of the operation.  Waverly Ferrari, MD, FACS [facility]  DATE OF DICTATION:   09/04/2020

## 2020-08-29 NOTE — ED Triage Notes (Signed)
Arrived via [facility] unresponsive with bradypnea and EMS assisted ventilations via BVM. EMS reports pt collapsed while ambulating in neighborhood. Pt was initially responsive per EMS and was placed on CPAP but then became unresponsive and bradypneic which is when they began ventilating pt via BVM. Pt in sinus tach on arrival and hypotensive. Pt pale and mottled. No obvious s/s of trauma.

## 2020-08-29 NOTE — H&P (Signed)
REASON FOR ADMISSION:    Ruptured abdominal aortic aneurysm.   ASSESSMENT & PLAN:   RUPTURED ABDOMINAL AORTIC ANEURYSM: Of note, this is dictated after the operation given the urgency of the situation as he was taken urgently from CAT scan to the operating room.  This patient presented with the acute onset abdominal pain and was hypotensive.  He subsequently arrested in the operating room and compressions were begun.  His CT scan showed a large ruptured abdominal aortic aneurysm with free intraperitoneal blood and he was not a candidate for endovascular repair as there was no adequate neck proximally.  Patient was taken urgently to the operating room for open repair of his ruptured abdominal aortic aneurysm as dictated in the operative report.   Clayton Ferrari, MD Office: [phone]   HPI:   Clayton Wright is a pleasant 60 y.o. male, who reportedly presented to the emergency department about 5:30 PM with abdominal pain.  He became unresponsive and required intubation.  Not much history could be obtained from the patient initially by the emergency department as the patient was unresponsive.  He was taken urgently to the CT scanner where it was confirmed that he had a large ruptured abdominal aortic aneurysm.  I did speak to the family after the procedure.  He does not routinely go to a doctor and does not take any medications.  They say that he is a heavy smoker smoking at least a pack per day of cigarettes.  He was not aware that he had an aneurysm.  They are not aware of any cardiac disease, diabetes, or other medical issues.  Past Medical History:  Diagnosis Date  . COPD (chronic obstructive pulmonary disease) (HCC)   . Pneumonia    02/2013    No family history on file.  SOCIAL HISTORY: He smokes at least 1 pack/day according to the family. Social History   Socioeconomic History  . Marital status: Divorced    Spouse name: Not on file  . Number of children: Not on file  .  Years of education: Not on file  . Highest education level: Not on file  Occupational History  . Not on file  Tobacco Use  . Smoking status: Current Every Day Smoker    Packs/day: 1.00    Types: Cigarettes  . Smokeless tobacco: Not on file  Substance and Sexual Activity  . Alcohol use: No  . Drug use: No  . Sexual activity: Not on file  Other Topics Concern  . Not on file  Social History Narrative  . Not on file   Social Determinants of Health   Financial Resource Strain: Not on file  Food Insecurity: Not on file  Transportation Needs: Not on file  Physical Activity: Not on file  Stress: Not on file  Social Connections: Not on file  Intimate Partner Violence: Not on file    Allergies  Allergen Reactions  . Amoxicillin   . Bee Venom     Current Facility-Administered Medications  Medication Dose Route Frequency Provider Last Rate Last Admin  . 0.9 % irrigation (POUR BTL)    PRN Chuck Hint, MD   2,000 mL at 09/17/2020 1926  . heparin 6,000 Units in sodium chloride 0.9 % 500 mL irrigation    PRN Chuck Hint, MD   Given at 09/11/2020 1936  . LORazepam (ATIVAN) 2 MG/ML injection           . norepinephrine (LEVOPHED) 4-5 MG/250ML-% infusion SOLN           .  norepinephrine (LEVOPHED) 4mg  in premix infusion  0-40 mcg/min Intravenous Continuous , MD 75 mL/hr at 13-Sep-2020 2136 20 mcg/min at 09/13/20 2136  . propofol (DIPRIVAN) 1000 MG/100ML infusion           . propofol (DIPRIVAN) 1000 MG/100ML infusion  5-80 mcg/kg/min Intravenous Continuous 2137, MD   20 mcg/kg/min at 2020/09/13 1744  . vasopressin (PITRESSIN) 20 Units in sodium chloride 0.9 % 100 mL infusion-*FOR SHOCK*  0-0.03 Units/min Intravenous Continuous 03-17-1970, MD 9 mL/hr at 09-13-20 2211 0.03 Units/min at 09/13/20 2211   Facility-Administered Medications Ordered in Other Encounters  Medication Dose Route Frequency Provider Last Rate Last Admin  . 0.9 %   sodium chloride infusion   Intravenous Continuous PRN 2212, CRNA   New Bag at 09-13-2020 1845  . albumin human 5 % solution   Intravenous Continuous PRN 10/29/20, CRNA   Stopped at 09-13-2020 2039  . albumin human 5 % solution   Intravenous Continuous PRN 2040, CRNA 0 mL/hr at 2020-09-13 2038 New Bag at 09-13-2020 2155  . albuterol (VENTOLIN HFA) 108 (90 Base) MCG/ACT inhaler   Inhalation Anesthesia Intra-op 2156, CRNA   2 puff at 09-13-20 2030  . calcium chloride injection   Intravenous Anesthesia Intra-op 10/29/20, CRNA   1 g at Sep 13, 2020 2144  . ceFAZolin (ANCEF) IVPB 2 g/50 mL premix   Intravenous Anesthesia Intra-op 2145, CRNA   2 g at September 13, 2020 1948  . dextrose 50 % solution   Intravenous Anesthesia Intra-op 10/29/20, CRNA   12.5 g at 13-Sep-2020 2203  . EPINEPHrine (ADRENALIN) 1 MG/10ML injection   Intravenous Anesthesia Intra-op 2204, CRNA   1 mg at 2020-09-13 1902  . EPINEPHrine (ADRENALIN) 1 MG/10ML injection   Intravenous Anesthesia Intra-op 10/29/20, CRNA   1 mg at 09/13/20 1859  . fentaNYL citrate (PF) (SUBLIMAZE) injection   Intravenous Anesthesia Intra-op 10/29/20, CRNA   100 mcg at Sep 13, 2020 1946  . hydrocortisone sodium succinate (SOLU-CORTEF) 100 MG injection   Intravenous Anesthesia Intra-op 10/29/20, CRNA   250 mg at 09/13/20 1936  . insulin aspart (novoLOG) injection   Intravenous Anesthesia Intra-op 10/29/20, CRNA   7 Units at September 13, 2020 2206  . lactated ringers infusion   Intravenous Continuous PRN 2207, CRNA   New Bag at September 13, 2020 1845  . midazolam (VERSED) injection   Intravenous Anesthesia Intra-op 10/29/20, CRNA   2 mg at Sep 13, 2020 2208  . sodium bicarbonate injection   Intravenous Anesthesia Intra-op 2209, CRNA   50 mEq at 2020-09-13 2040  . vasopressin (PITRESSIN) 20 UNIT/ML injection   Intravenous Anesthesia Intra-op 2041, CRNA   2 Units at Sep 13, 2020 2134     REVIEW OF SYSTEMS: Unable to obtain  PHYSICAL EXAM:   Vitals:   09/13/2020 1805 13-Sep-2020 1810 13-Sep-2020 1815 09/13/2020 1830  BP: (!) 159/118 (!) 62/46 (!) 96/47 (!) 51/29  Pulse:   (!) 102   Resp: (!) 23 (!) 31 (!) 33 (!) 30  Temp: 97.8 F (36.6 C) 98.3 F (36.8 C) 98.7 F (37.1 C) 99 F (37.2 C)  SpO2:   96%     GENERAL: The patient is a  male, . The vital signs are documented above. CARDIAC: He is tachycardic.  VASCULAR: I do not palpate femoral or pedal pulses. PULMONARY: There is good air exchange bilaterally without wheezing or rales. ABDOMEN:  He has a large abdomen and it is difficult to assess because of his size.  MUSCULOSKELETAL: There are no major deformities or cyanosis. NEUROLOGIC: Unable to assess  SKIN: There are no ulcers or rashes noted. PSYCHIATRIC: Unable to assess  DATA:    CT SCAN: CT scan shows a ruptured 7 cm infrarenal abdominal aortic aneurysm with a large associated retroperitoneal hematoma.  Free fluid is noted in the peritoneal cavity.

## 2020-08-29 NOTE — Consult Note (Addendum)
NAME:  Clayton Wright, MRN:  [id number], DOB:  19-Jul-1960, LOS: 0 ADMISSION DATE:  Sep 20, 2020, CONSULTATION DATE:  September 20, 2020 REFERRING MD:  Dr. Edilia Bo , CHIEF COMPLAINT:  AAA rupture.    History of Present Illness:  60 year old male presents to ED on 3/8 after collapsing in neighborhood. Per EMS patient was initially responsive with report abdominal pain however shortly after become unresponsive with bradypnea requiring assisted ventilations with BVM. On arrival to ED remained unresponsive and hypotensive requiring intubation. CTA A/P with ruptured 7 cm infrarenal abdominal aortic aneurysm and large retroperitoneal hematoma. Taken to OR for repair with bypass graft and placement of abdominal VAC, while in OR with cardiac arrest, given 5 units RBC, 5 units FFP, 1 unit PLT, 1 unit Cryo. Post-operatively transferred to ICU.   Past Medical History:  COPD, Tobacco Use  Significant Hospital Events:  3/8 > Presents to ED, Taken to OR  Consults:  PCCM Vascular   Procedures:  ETT 3/8 >>   Significant Diagnostic Tests:  CTA A/P 3/8 > 1. Ruptured 7 cm infrarenal abdominal aortic aneurysm, with a rent identified in the right posterolateral aspect of the infrarenal aorta as above. Large retroperitoneal hematoma, right greater than left, with displacement of the right kidney as result. NON-VASCULAR 1. Large retroperitoneal hematoma as above. 2. Trace free fluid within the pelvis.  Micro Data:    Antimicrobials:     Interim History / Subjective:  As above.   Objective   Blood pressure (!) 51/29, pulse (!) 102, temperature 99 F (37.2 C), resp. rate (!) 30, SpO2 96 %.    Vent Mode: PRVC FiO2 (%):  [100 %] 100 % Set Rate:  [16 bmp] 16 bmp Vt Set:  [550 mL] 550 mL PEEP:  [5 cmH20] 5 cmH20 Plateau Pressure:  [18 cmH20] 18 cmH20   Intake/Output Summary (Last 24 hours) at Sep 20, 2020 2212 Last data filed at 09/20/20 2201 Gross per 24 hour  Intake 5830 ml  Output --  Net 5830 ml   There  were no vitals filed for this visit.  Examination: General: critically ill adult male on vent  HENT: ETT/OG in place > OG with dark red blood  Lungs: clear breath sounds, vent assisted breaths  Cardiovascular: tachy, no MRG Abdomen: vac in place >> vac with bright red blood  Extremities: -edema, warm, pulses +2 to BLE Neuro: unresponsive, pupils intact 2 mm and reactive, no cough/gag GU: foley in place  Resolved Hospital Problem list     Assessment & Plan:   Ruptured AAA s/p Graft and Vac Placement Plan -Per Vascular   Hemorraghic Shock secondary to above Plan -Continue to transfuse blood products as below. DIC and CBC pending -Titrate Levophed for MAP goal >65. Add vasopressin. -Giving 1L LR now.   Respiratory Insufficieny in setting of above.  H/O COPD, Ongoing Tobacco use  Plan -Vent Support  -Obtain CXR and ABG -VAP Prevention Bundle -Propofol and PRN Fentanyl for RASS goal -1/-2  Anion Gap Metabolic Acidosis, Lactic Acidosis, Acute Renal Injury in setting of hypoperfusion, acute blood loss Hypokalemia  Plan -Trend BMP -Trend Lactic Acid  -Replace electrolytes as indicated   Acute Blood Loss in setting of AAA Rupture  -INR 1.2 -S/P 5 FFP, 1 Cryo, 1 PLT, 5 RBC Plan -Trend CBC -Transfuse additional 2 units RBC, 1 unit PLT, 1 unit FFP now  -Obtain DIC panel   Leucocytosis, presumed reactive due to above.  Plan -Trend WBC and Fever Curve  Hyperglycemia  Plan -  Trend Glucose -SSI  -Obtain Hemoglobin AIC  Best practice (evaluated daily)  Diet: NPO Pain/Anxiety/Delirium protocol (if indicated): VAP protocol (if indicated): DVT prophylaxis: SCD GI prophylaxis: PPI Glucose control: SSI Mobility: Bedrest  Disposition:Admit to ICU   Goals of Care:  Last date of multidisciplinary goals of care discussion: Family and staff present:  Summary of discussion:  Follow up goals of care discussion due:  Code Status: Full Code.   Labs   CBC: Recent Labs   Lab 09/03/2020 1742 09/03/2020 1745  WBC  --  17.9*  HGB 11.9* 12.5*  HCT 35.0* 38.8*  MCV  --  95.6  PLT  --  188    Basic Metabolic Panel: Recent Labs  Lab 2020-09-03 1742 03-Sep-2020 1745  NA 139 136  K 3.0* 2.9*  CL 103 100  CO2  --  15*  GLUCOSE 206* 224*  BUN 13 12  CREATININE 1.60* 1.73*  CALCIUM  --  9.2   GFR: CrCl cannot be calculated (Unknown ideal weight.). Recent Labs  Lab 2020-09-03 1745 2020-09-03 1746  WBC 17.9*  --   LATICACIDVEN  --  >11.0*    Liver Function Tests: Recent Labs  Lab 09-03-20 1745  AST 25  ALT 19  ALKPHOS 55  BILITOT 0.9  PROT 6.2*  ALBUMIN 3.6   No results for input(s): LIPASE, AMYLASE in the last 168 hours. No results for input(s): AMMONIA in the last 168 hours.  ABG    Component Value Date/Time   TCO2 17 (L) 09-03-2020 1742     Coagulation Profile: Recent Labs  Lab September 03, 2020 1745  INR 1.2    Cardiac Enzymes: No results for input(s): CKTOTAL, CKMB, CKMBINDEX, TROPONINI in the last 168 hours.  HbA1C: No results found for: HGBA1C  CBG: No results for input(s): GLUCAP in the last 168 hours.  Review of Systems:   Unable to review as patient is intubated and sedated.   Past Medical History:  He,  has a past medical history of COPD (chronic obstructive pulmonary disease) (HCC) and Pneumonia.   Surgical History:  No past surgical history on file.   Social History:   reports that he has been smoking cigarettes. He has been smoking about 1.00 pack per day. He does not have any smokeless tobacco history on file. He reports that he does not drink alcohol and does not use drugs.   Family History:  His family history is not on file.   Allergies Allergies  Allergen Reactions  . Amoxicillin   . Bee Venom      Home Medications  Prior to Admission medications   Medication Sig Start Date End Date Taking? Authorizing Provider  azithromycin (ZITHROMAX) 250 MG tablet Take 1 tablet (250 mg total) by mouth daily. Take  first 2 tablets together, then 1 every day until finished. 06/13/13   Rodolph Bong, MD  HYDROcodone-acetaminophen (NORCO/VICODIN) 5-325 MG per tablet Take 1 tablet by mouth every 6 (six) hours as needed. 06/13/13   Rodolph Bong, MD  ondansetron (ZOFRAN) 8 MG tablet Take 1 tablet (8 mg total) by mouth every 8 (eight) hours as needed for nausea or vomiting. 06/13/13   Rodolph Bong, MD     Critical care time: 52 minutes     Jovita Kussmaul, AGACNP-BC [facility]  PCCM Pgr: [phone]

## 2020-08-30 ENCOUNTER — Encounter (HOSPITAL_COMMUNITY): Payer: Self-pay | Admitting: Vascular Surgery

## 2020-08-30 ENCOUNTER — Inpatient Hospital Stay (HOSPITAL_COMMUNITY): Payer: BC Managed Care – PPO

## 2020-08-30 DIAGNOSIS — I468 Cardiac arrest due to other underlying condition: Secondary | ICD-10-CM

## 2020-08-30 LAB — BPAM PLATELET PHERESIS
Blood Product Expiration Date: 202203102359
Blood Product Expiration Date: 202203102359
ISSUE DATE / TIME: 202203081901
ISSUE DATE / TIME: 202203082314
Unit Type and Rh: 5100
Unit Type and Rh: 6200

## 2020-08-30 LAB — DIC (DISSEMINATED INTRAVASCULAR COAGULATION)PANEL
D-Dimer, Quant: >20 ug/mL-FEU — ABNORMAL HIGH (ref 0.00–0.50)
D-Dimer, Quant: >20 ug/mL-FEU — ABNORMAL HIGH (ref 0.00–0.50)
D-Dimer, Quant: >20 ug/mL-FEU — ABNORMAL HIGH (ref 0.00–0.50)
D-Dimer, Quant: >20 ug/mL-FEU — ABNORMAL HIGH (ref 0.00–0.50)
Fibrinogen: 96 mg/dL — CL (ref 210–475)
Fibrinogen: 160 mg/dL — ABNORMAL LOW (ref 210–475)
Fibrinogen: 103 mg/dL — ABNORMAL LOW (ref 210–475)
Fibrinogen: 154 mg/dL — ABNORMAL LOW (ref 210–475)
INR: 2.8 — ABNORMAL HIGH (ref 0.8–1.2)
INR: 2.7 — ABNORMAL HIGH (ref 0.8–1.2)
INR: 3 — ABNORMAL HIGH (ref 0.8–1.2)
INR: 2.6 — ABNORMAL HIGH (ref 0.8–1.2)
Platelets: 33 10*3/uL — ABNORMAL LOW (ref 150–400)
Platelets: 63 10*3/uL — ABNORMAL LOW (ref 150–400)
Platelets: 79 10*3/uL — ABNORMAL LOW (ref 150–400)
Platelets: 39 10*3/uL — ABNORMAL LOW (ref 150–400)
Prothrombin Time: 28.2 seconds — ABNORMAL HIGH (ref 11.4–15.2)
Prothrombin Time: 27.7 seconds — ABNORMAL HIGH (ref 11.4–15.2)
Prothrombin Time: 30.1 seconds — ABNORMAL HIGH (ref 11.4–15.2)
Prothrombin Time: 26.7 seconds — ABNORMAL HIGH (ref 11.4–15.2)
Smear Review: NONE SEEN
Smear Review: NONE SEEN
Smear Review: NONE SEEN
Smear Review: NORMAL
aPTT: >200 seconds (ref 24–36)
aPTT: 113 seconds — ABNORMAL HIGH (ref 24–36)
aPTT: 100 seconds — ABNORMAL HIGH (ref 24–36)
aPTT: 63 seconds — ABNORMAL HIGH (ref 24–36)

## 2020-08-30 LAB — POCT I-STAT 7, (LYTES, BLD GAS, ICA,H+H)
Acid-base deficit: 11 mmol/L — ABNORMAL HIGH (ref 0.0–2.0)
Acid-base deficit: 12 mmol/L — ABNORMAL HIGH (ref 0.0–2.0)
Acid-base deficit: 13 mmol/L — ABNORMAL HIGH (ref 0.0–2.0)
Acid-base deficit: 13 mmol/L — ABNORMAL HIGH (ref 0.0–2.0)
Acid-base deficit: 13 mmol/L — ABNORMAL HIGH (ref 0.0–2.0)
Acid-base deficit: 14 mmol/L — ABNORMAL HIGH (ref 0.0–2.0)
Acid-base deficit: 15 mmol/L — ABNORMAL HIGH (ref 0.0–2.0)
Acid-base deficit: 15 mmol/L — ABNORMAL HIGH (ref 0.0–2.0)
Acid-base deficit: 16 mmol/L — ABNORMAL HIGH (ref 0.0–2.0)
Acid-base deficit: 7 mmol/L — ABNORMAL HIGH (ref 0.0–2.0)
Bicarbonate: 14.3 mmol/L — ABNORMAL LOW (ref 20.0–28.0)
Bicarbonate: 14.6 mmol/L — ABNORMAL LOW (ref 20.0–28.0)
Bicarbonate: 14.7 mmol/L — ABNORMAL LOW (ref 20.0–28.0)
Bicarbonate: 14.7 mmol/L — ABNORMAL LOW (ref 20.0–28.0)
Bicarbonate: 15 mmol/L — ABNORMAL LOW (ref 20.0–28.0)
Bicarbonate: 15.3 mmol/L — ABNORMAL LOW (ref 20.0–28.0)
Bicarbonate: 15.4 mmol/L — ABNORMAL LOW (ref 20.0–28.0)
Bicarbonate: 15.7 mmol/L — ABNORMAL LOW (ref 20.0–28.0)
Bicarbonate: 15.7 mmol/L — ABNORMAL LOW (ref 20.0–28.0)
Bicarbonate: 18 mmol/L — ABNORMAL LOW (ref 20.0–28.0)
Calcium, Ion: 0.8 mmol/L — CL (ref 1.15–1.40)
Calcium, Ion: 0.81 mmol/L — CL (ref 1.15–1.40)
Calcium, Ion: 0.87 mmol/L — CL (ref 1.15–1.40)
Calcium, Ion: 0.9 mmol/L — ABNORMAL LOW (ref 1.15–1.40)
Calcium, Ion: 0.92 mmol/L — ABNORMAL LOW (ref 1.15–1.40)
Calcium, Ion: 0.98 mmol/L — ABNORMAL LOW (ref 1.15–1.40)
Calcium, Ion: 0.99 mmol/L — ABNORMAL LOW (ref 1.15–1.40)
Calcium, Ion: 1 mmol/L — ABNORMAL LOW (ref 1.15–1.40)
Calcium, Ion: 1.01 mmol/L — ABNORMAL LOW (ref 1.15–1.40)
Calcium, Ion: 1.01 mmol/L — ABNORMAL LOW (ref 1.15–1.40)
HCT: 17 % — ABNORMAL LOW (ref 39.0–52.0)
HCT: 23 % — ABNORMAL LOW (ref 39.0–52.0)
HCT: 23 % — ABNORMAL LOW (ref 39.0–52.0)
HCT: 23 % — ABNORMAL LOW (ref 39.0–52.0)
HCT: 24 % — ABNORMAL LOW (ref 39.0–52.0)
HCT: 26 % — ABNORMAL LOW (ref 39.0–52.0)
HCT: 27 % — ABNORMAL LOW (ref 39.0–52.0)
HCT: 28 % — ABNORMAL LOW (ref 39.0–52.0)
HCT: 29 % — ABNORMAL LOW (ref 39.0–52.0)
HCT: 31 % — ABNORMAL LOW (ref 39.0–52.0)
Hemoglobin: 10.5 g/dL — ABNORMAL LOW (ref 13.0–17.0)
Hemoglobin: 5.8 g/dL — CL (ref 13.0–17.0)
Hemoglobin: 7.8 g/dL — ABNORMAL LOW (ref 13.0–17.0)
Hemoglobin: 7.8 g/dL — ABNORMAL LOW (ref 13.0–17.0)
Hemoglobin: 7.8 g/dL — ABNORMAL LOW (ref 13.0–17.0)
Hemoglobin: 8.2 g/dL — ABNORMAL LOW (ref 13.0–17.0)
Hemoglobin: 8.8 g/dL — ABNORMAL LOW (ref 13.0–17.0)
Hemoglobin: 9.2 g/dL — ABNORMAL LOW (ref 13.0–17.0)
Hemoglobin: 9.5 g/dL — ABNORMAL LOW (ref 13.0–17.0)
Hemoglobin: 9.9 g/dL — ABNORMAL LOW (ref 13.0–17.0)
O2 Saturation: 100 %
O2 Saturation: 93 %
O2 Saturation: 97 %
O2 Saturation: 99 %
O2 Saturation: 99 %
O2 Saturation: 99 %
O2 Saturation: 99 %
O2 Saturation: 99 %
O2 Saturation: 99 %
O2 Saturation: 99 %
Patient temperature: 34.2
Patient temperature: 34.4
Patient temperature: 34.5
Patient temperature: 34.5
Patient temperature: 35.3
Patient temperature: 35.4
Patient temperature: 35.4
Patient temperature: 35.5
Patient temperature: 37.7
Patient temperature: 38.2
Potassium: 3.8 mmol/L (ref 3.5–5.1)
Potassium: 3.9 mmol/L (ref 3.5–5.1)
Potassium: 4 mmol/L (ref 3.5–5.1)
Potassium: 4.1 mmol/L (ref 3.5–5.1)
Potassium: 4.2 mmol/L (ref 3.5–5.1)
Potassium: 4.4 mmol/L (ref 3.5–5.1)
Potassium: 4.6 mmol/L (ref 3.5–5.1)
Potassium: 5.1 mmol/L (ref 3.5–5.1)
Potassium: 5.2 mmol/L — ABNORMAL HIGH (ref 3.5–5.1)
Potassium: 6 mmol/L — ABNORMAL HIGH (ref 3.5–5.1)
Sodium: 134 mmol/L — ABNORMAL LOW (ref 135–145)
Sodium: 134 mmol/L — ABNORMAL LOW (ref 135–145)
Sodium: 137 mmol/L (ref 135–145)
Sodium: 137 mmol/L (ref 135–145)
Sodium: 139 mmol/L (ref 135–145)
Sodium: 140 mmol/L (ref 135–145)
Sodium: 141 mmol/L (ref 135–145)
Sodium: 141 mmol/L (ref 135–145)
Sodium: 142 mmol/L (ref 135–145)
Sodium: 142 mmol/L (ref 135–145)
TCO2: 16 mmol/L — ABNORMAL LOW (ref 22–32)
TCO2: 16 mmol/L — ABNORMAL LOW (ref 22–32)
TCO2: 16 mmol/L — ABNORMAL LOW (ref 22–32)
TCO2: 16 mmol/L — ABNORMAL LOW (ref 22–32)
TCO2: 16 mmol/L — ABNORMAL LOW (ref 22–32)
TCO2: 16 mmol/L — ABNORMAL LOW (ref 22–32)
TCO2: 17 mmol/L — ABNORMAL LOW (ref 22–32)
TCO2: 17 mmol/L — ABNORMAL LOW (ref 22–32)
TCO2: 18 mmol/L — ABNORMAL LOW (ref 22–32)
TCO2: 19 mmol/L — ABNORMAL LOW (ref 22–32)
pCO2 arterial: 34.9 mmHg (ref 32.0–48.0)
pCO2 arterial: 36.9 mmHg (ref 32.0–48.0)
pCO2 arterial: 37.1 mmHg (ref 32.0–48.0)
pCO2 arterial: 37.2 mmHg (ref 32.0–48.0)
pCO2 arterial: 38.8 mmHg (ref 32.0–48.0)
pCO2 arterial: 39 mmHg (ref 32.0–48.0)
pCO2 arterial: 39.9 mmHg (ref 32.0–48.0)
pCO2 arterial: 47.3 mmHg (ref 32.0–48.0)
pCO2 arterial: 52.3 mmHg — ABNORMAL HIGH (ref 32.0–48.0)
pCO2 arterial: 69.1 mmHg (ref 32.0–48.0)
pH, Arterial: 6.955 — CL (ref 7.350–7.450)
pH, Arterial: 7.069 — CL (ref 7.350–7.450)
pH, Arterial: 7.079 — CL (ref 7.350–7.450)
pH, Arterial: 7.162 — CL (ref 7.350–7.450)
pH, Arterial: 7.185 — CL (ref 7.350–7.450)
pH, Arterial: 7.188 — CL (ref 7.350–7.450)
pH, Arterial: 7.19 — CL (ref 7.350–7.450)
pH, Arterial: 7.218 — ABNORMAL LOW (ref 7.350–7.450)
pH, Arterial: 7.228 — ABNORMAL LOW (ref 7.350–7.450)
pH, Arterial: 7.301 — ABNORMAL LOW (ref 7.350–7.450)
pO2, Arterial: 124 mmHg — ABNORMAL HIGH (ref 83.0–108.0)
pO2, Arterial: 141 mmHg — ABNORMAL HIGH (ref 83.0–108.0)
pO2, Arterial: 152 mmHg — ABNORMAL HIGH (ref 83.0–108.0)
pO2, Arterial: 166 mmHg — ABNORMAL HIGH (ref 83.0–108.0)
pO2, Arterial: 185 mmHg — ABNORMAL HIGH (ref 83.0–108.0)
pO2, Arterial: 188 mmHg — ABNORMAL HIGH (ref 83.0–108.0)
pO2, Arterial: 191 mmHg — ABNORMAL HIGH (ref 83.0–108.0)
pO2, Arterial: 197 mmHg — ABNORMAL HIGH (ref 83.0–108.0)
pO2, Arterial: 385 mmHg — ABNORMAL HIGH (ref 83.0–108.0)
pO2, Arterial: 78 mmHg — ABNORMAL LOW (ref 83.0–108.0)

## 2020-08-30 LAB — CBC
HCT: 21.3 % — ABNORMAL LOW (ref 39.0–52.0)
HCT: 28.2 % — ABNORMAL LOW (ref 39.0–52.0)
HCT: 19.9 % — ABNORMAL LOW (ref 39.0–52.0)
Hemoglobin: 7.2 g/dL — ABNORMAL LOW (ref 13.0–17.0)
Hemoglobin: 9.4 g/dL — ABNORMAL LOW (ref 13.0–17.0)
Hemoglobin: 7 g/dL — ABNORMAL LOW (ref 13.0–17.0)
MCH: 31 pg (ref 26.0–34.0)
MCH: 30.7 pg (ref 26.0–34.0)
MCH: 31.3 pg (ref 26.0–34.0)
MCHC: 33.8 g/dL (ref 30.0–36.0)
MCHC: 33.3 g/dL (ref 30.0–36.0)
MCHC: 35.2 g/dL (ref 30.0–36.0)
MCV: 91.8 fL (ref 80.0–100.0)
MCV: 92.2 fL (ref 80.0–100.0)
MCV: 88.8 fL (ref 80.0–100.0)
Platelets: 64 10*3/uL — ABNORMAL LOW (ref 150–400)
Platelets: 80 10*3/uL — ABNORMAL LOW (ref 150–400)
Platelets: 38 10*3/uL — ABNORMAL LOW (ref 150–400)
RBC: 2.32 MIL/uL — ABNORMAL LOW (ref 4.22–5.81)
RBC: 3.06 MIL/uL — ABNORMAL LOW (ref 4.22–5.81)
RBC: 2.24 MIL/uL — ABNORMAL LOW (ref 4.22–5.81)
RDW: 15.2 % (ref 11.5–15.5)
RDW: 14.3 % (ref 11.5–15.5)
RDW: 14.4 % (ref 11.5–15.5)
WBC: 20 10*3/uL — ABNORMAL HIGH (ref 4.0–10.5)
WBC: 17.9 10*3/uL — ABNORMAL HIGH (ref 4.0–10.5)
WBC: 17.6 10*3/uL — ABNORMAL HIGH (ref 4.0–10.5)
nRBC: 0.3 % — ABNORMAL HIGH (ref 0.0–0.2)
nRBC: 0.6 % — ABNORMAL HIGH (ref 0.0–0.2)
nRBC: 0.8 % — ABNORMAL HIGH (ref 0.0–0.2)

## 2020-08-30 LAB — TYPE AND SCREEN
ABO/RH(D): O POS
Unit division: 0
Unit division: 0

## 2020-08-30 LAB — COMPREHENSIVE METABOLIC PANEL
ALT: 495 U/L — ABNORMAL HIGH (ref 0–44)
AST: 484 U/L — ABNORMAL HIGH (ref 15–41)
Albumin: 1.9 g/dL — ABNORMAL LOW (ref 3.5–5.0)
Alkaline Phosphatase: 68 U/L (ref 38–126)
Anion gap: 22 — ABNORMAL HIGH (ref 5–15)
BUN: 14 mg/dL (ref 6–20)
CO2: 14 mmol/L — ABNORMAL LOW (ref 22–32)
Calcium: 7.6 mg/dL — ABNORMAL LOW (ref 8.9–10.3)
Chloride: 101 mmol/L (ref 98–111)
Creatinine, Ser: 2.44 mg/dL — ABNORMAL HIGH (ref 0.61–1.24)
GFR, Estimated: 30 mL/min — ABNORMAL LOW (ref >60)
Glucose, Bld: 361 mg/dL — ABNORMAL HIGH (ref 70–99)
Potassium: 4 mmol/L (ref 3.5–5.1)
Sodium: 137 mmol/L (ref 135–145)
Total Bilirubin: 1.2 mg/dL (ref 0.3–1.2)
Total Protein: 3.2 g/dL — ABNORMAL LOW (ref 6.5–8.1)

## 2020-08-30 LAB — BPAM CRYOPRECIPITATE
Blood Product Expiration Date: 202203090135
ISSUE DATE / TIME: 202203081948
Unit Type and Rh: 5100

## 2020-08-30 LAB — PREPARE CRYOPRECIPITATE: Unit division: 0

## 2020-08-30 LAB — GLUCOSE, CAPILLARY
Glucose-Capillary: 308 mg/dL — ABNORMAL HIGH (ref 70–99)
Glucose-Capillary: 208 mg/dL — ABNORMAL HIGH (ref 70–99)
Glucose-Capillary: 257 mg/dL — ABNORMAL HIGH (ref 70–99)
Glucose-Capillary: 354 mg/dL — ABNORMAL HIGH (ref 70–99)
Glucose-Capillary: 134 mg/dL — ABNORMAL HIGH (ref 70–99)

## 2020-08-30 LAB — PREPARE PLATELET PHERESIS
Unit division: 0
Unit division: 0

## 2020-08-30 LAB — POCT I-STAT, CHEM 8
BUN: 16 mg/dL (ref 6–20)
BUN: 16 mg/dL (ref 6–20)
Calcium, Ion: 1.19 mmol/L (ref 1.15–1.40)
Calcium, Ion: 1.11 mmol/L — ABNORMAL LOW (ref 1.15–1.40)
Chloride: 98 mmol/L (ref 98–111)
Chloride: 100 mmol/L (ref 98–111)
Creatinine, Ser: 1.7 mg/dL — ABNORMAL HIGH (ref 0.61–1.24)
Creatinine, Ser: 1.7 mg/dL — ABNORMAL HIGH (ref 0.61–1.24)
Glucose, Bld: 415 mg/dL — ABNORMAL HIGH (ref 70–99)
Glucose, Bld: 344 mg/dL — ABNORMAL HIGH (ref 70–99)
HCT: 29 % — ABNORMAL LOW (ref 39.0–52.0)
HCT: 30 % — ABNORMAL LOW (ref 39.0–52.0)
Hemoglobin: 9.9 g/dL — ABNORMAL LOW (ref 13.0–17.0)
Hemoglobin: 10.2 g/dL — ABNORMAL LOW (ref 13.0–17.0)
Potassium: 6.8 mmol/L (ref 3.5–5.1)
Potassium: 5 mmol/L (ref 3.5–5.1)
Sodium: 132 mmol/L — ABNORMAL LOW (ref 135–145)
Sodium: 137 mmol/L (ref 135–145)
TCO2: 22 mmol/L (ref 22–32)
TCO2: 19 mmol/L — ABNORMAL LOW (ref 22–32)

## 2020-08-30 LAB — PREPARE RBC (CROSSMATCH)

## 2020-08-30 LAB — BASIC METABOLIC PANEL
Anion gap: 22 — ABNORMAL HIGH (ref 5–15)
BUN: 15 mg/dL (ref 6–20)
CO2: 17 mmol/L — ABNORMAL LOW (ref 22–32)
Calcium: 8.5 mg/dL — ABNORMAL LOW (ref 8.9–10.3)
Chloride: 103 mmol/L (ref 98–111)
Creatinine, Ser: 3.29 mg/dL — ABNORMAL HIGH (ref 0.61–1.24)
GFR, Estimated: 21 mL/min — ABNORMAL LOW (ref >60)
Glucose, Bld: 137 mg/dL — ABNORMAL HIGH (ref 70–99)
Potassium: 4.1 mmol/L (ref 3.5–5.1)
Sodium: 142 mmol/L (ref 135–145)

## 2020-08-30 LAB — ECHOCARDIOGRAM LIMITED
Height: 71 in
S' Lateral: 2.2 cm
Weight: 4204.61 oz

## 2020-08-30 LAB — FIBRINOGEN: Fibrinogen: 160 mg/dL — ABNORMAL LOW (ref 210–475)

## 2020-08-30 LAB — HEMOGLOBIN A1C
Hgb A1c MFr Bld: 5.7 % — ABNORMAL HIGH (ref 4.8–5.6)
Mean Plasma Glucose: 116.89 mg/dL

## 2020-08-30 LAB — PROTIME-INR
INR: 2.6 — ABNORMAL HIGH (ref 0.8–1.2)
Prothrombin Time: 27.3 seconds — ABNORMAL HIGH (ref 11.4–15.2)

## 2020-08-30 LAB — BPAM RBC
Blood Product Expiration Date: 202204052359
ISSUE DATE / TIME: 202203081819
ISSUE DATE / TIME: 202203082336
Unit Type and Rh: 5100
Unit Type and Rh: 5100

## 2020-08-30 LAB — LACTIC ACID, PLASMA
Lactic Acid, Venous: >11 mmol/L (ref 0.5–1.9)
Lactic Acid, Venous: >11 mmol/L (ref 0.5–1.9)
Lactic Acid, Venous: >11 mmol/L (ref 0.5–1.9)

## 2020-08-30 LAB — AMYLASE: Amylase: 507 U/L — ABNORMAL HIGH (ref 28–100)

## 2020-08-30 LAB — MRSA PCR SCREENING: MRSA by PCR: NEGATIVE

## 2020-08-30 LAB — MAGNESIUM: Magnesium: 2.4 mg/dL (ref 1.7–2.4)

## 2020-08-30 MED ORDER — CHLORHEXIDINE GLUCONATE 0.12% ORAL RINSE (MEDLINE KIT): 15.0000 mL | Freq: Two times a day (BID) | OROMUCOSAL | Status: DC

## 2020-08-30 MED ORDER — CALCIUM CHLORIDE 10 % IV SOLN
INTRAVENOUS | Status: AC
  Filled 2020-08-30: qty 10

## 2020-08-30 MED ORDER — FENTANYL 2500MCG IN NS 250ML (10MCG/ML) PREMIX INFUSION
0.0000 ug/h | INTRAVENOUS | Status: DC
  Administered 2020-08-30: 75 ug/h via INTRAVENOUS
  Filled 2020-08-30: qty 250

## 2020-08-30 MED ORDER — SODIUM BICARBONATE 8.4 % IV SOLN
100.0000 meq | Freq: Once | INTRAVENOUS | Status: AC
  Administered 2020-08-30: 100 meq via INTRAVENOUS
  Filled 2020-08-30: qty 100

## 2020-08-30 MED ORDER — POLYVINYL ALCOHOL 1.4 % OP SOLN
1.0000 [drp] | Freq: Four times a day (QID) | OPHTHALMIC | Status: DC | PRN
  Filled 2020-08-30: qty 15

## 2020-08-30 MED ORDER — SODIUM CHLORIDE 0.9% IV SOLUTION: Freq: Once | INTRAVENOUS | Status: AC

## 2020-08-30 MED ORDER — MIDAZOLAM BOLUS VIA INFUSION (WITHDRAWAL LIFE SUSTAINING TX)
2.0000 mg | INTRAVENOUS | Status: DC | PRN
Start: 2020-08-30 — End: 2020-08-31
  Filled 2020-08-30: qty 2

## 2020-08-30 MED ORDER — HALOPERIDOL LACTATE 5 MG/ML IJ SOLN
2.5000 mg | INTRAMUSCULAR | Status: DC | PRN
Start: 2020-08-30 — End: 2020-08-31

## 2020-08-30 MED ORDER — CALCIUM GLUCONATE-NACL 2-0.675 GM/100ML-% IV SOLN
2.0000 g | Freq: Once | INTRAVENOUS | Status: DC
  Filled 2020-08-30: qty 100

## 2020-08-30 MED ORDER — ORAL CARE MOUTH RINSE
15.0000 mL | OROMUCOSAL | Status: DC
  Administered 2020-08-30: 15 mL via OROMUCOSAL

## 2020-08-30 MED ORDER — SODIUM BICARBONATE 8.4 % IV SOLN: 100.0000 meq | Freq: Once | INTRAVENOUS | Status: AC

## 2020-08-30 MED ORDER — CALCIUM CHLORIDE 10 % IV SOLN
INTRAVENOUS | Status: AC
  Administered 2020-08-30: 2 g via INTRAVENOUS
  Filled 2020-08-30: qty 20

## 2020-08-30 MED ORDER — ALBUTEROL SULFATE HFA 108 (90 BASE) MCG/ACT IN AERS
1.0000 | INHALATION_SPRAY | Freq: Four times a day (QID) | RESPIRATORY_TRACT | Status: DC | PRN
  Filled 2020-08-30: qty 6.7

## 2020-08-30 MED ORDER — GLYCOPYRROLATE 0.2 MG/ML IJ SOLN: 0.2000 mg | INTRAMUSCULAR | Status: DC | PRN

## 2020-08-30 MED ORDER — SODIUM CHLORIDE 0.9 % IV BOLUS
1000.0000 mL | Freq: Once | INTRAVENOUS | Status: AC
  Administered 2020-08-30: 1000 mL via INTRAVENOUS

## 2020-08-30 MED ORDER — ACETAMINOPHEN 325 MG PO TABS: 650.0000 mg | ORAL_TABLET | Freq: Four times a day (QID) | ORAL | Status: DC | PRN

## 2020-08-30 MED ORDER — GLYCOPYRROLATE 1 MG PO TABS
1.0000 mg | ORAL_TABLET | ORAL | Status: DC | PRN
  Filled 2020-08-30: qty 1

## 2020-08-30 MED ORDER — SODIUM CHLORIDE 0.9% IV SOLUTION: Freq: Once | INTRAVENOUS | Status: DC

## 2020-08-30 MED ORDER — DEXTROSE 5 % IV SOLN: INTRAVENOUS | Status: DC

## 2020-08-30 MED ORDER — SODIUM BICARBONATE 8.4 % IV SOLN
INTRAVENOUS | Status: AC
  Administered 2020-08-30: 100 meq via INTRAVENOUS
  Filled 2020-08-30: qty 50

## 2020-08-30 MED ORDER — MIDAZOLAM 50MG/50ML (1MG/ML) PREMIX INFUSION: 0.0000 mg/h | INTRAVENOUS | Status: DC

## 2020-08-30 MED ORDER — FENTANYL BOLUS VIA INFUSION
100.0000 ug | INTRAVENOUS | Status: DC | PRN
  Filled 2020-08-30: qty 100

## 2020-08-30 MED ORDER — STERILE WATER FOR INJECTION IV SOLN
INTRAVENOUS | Status: DC
  Filled 2020-08-30 (×3): qty 850

## 2020-08-30 MED ORDER — CHLORHEXIDINE GLUCONATE CLOTH 2 % EX PADS: 6.0000 | MEDICATED_PAD | Freq: Every day | CUTANEOUS | Status: DC

## 2020-08-30 MED ORDER — ALBUTEROL SULFATE (2.5 MG/3ML) 0.083% IN NEBU
2.5000 mg | INHALATION_SOLUTION | Freq: Four times a day (QID) | RESPIRATORY_TRACT | Status: DC
  Filled 2020-08-30: qty 3

## 2020-08-30 MED ORDER — NOREPINEPHRINE 4 MG/250ML-% IV SOLN
0.0000 ug/min | INTRAVENOUS | Status: DC
  Administered 2020-08-30: 40 ug/min via INTRAVENOUS
  Administered 2020-08-30: 30 ug/min via INTRAVENOUS
  Administered 2020-08-30: 45 ug/min via INTRAVENOUS
  Administered 2020-08-30: 10 ug/min via INTRAVENOUS
  Filled 2020-08-30: qty 500
  Filled 2020-08-30: qty 250

## 2020-08-30 MED ORDER — CALCIUM CHLORIDE 10 % IV SOLN
1.0000 g | Freq: Once | INTRAVENOUS | Status: AC
  Administered 2020-08-30: 1 g via INTRAVENOUS

## 2020-08-30 MED ORDER — CALCIUM CHLORIDE 10 % IV SOLN: 2.0000 g | Freq: Once | INTRAVENOUS | Status: AC

## 2020-08-30 MED ORDER — NOREPINEPHRINE 4 MG/250ML-% IV SOLN
INTRAVENOUS | Status: AC
  Filled 2020-08-30: qty 500

## 2020-08-30 MED ORDER — FENTANYL 2500MCG IN NS 250ML (10MCG/ML) PREMIX INFUSION: 0.0000 ug/h | INTRAVENOUS | Status: DC

## 2020-08-30 MED ORDER — MIDAZOLAM 50MG/50ML (1MG/ML) PREMIX INFUSION
0.5000 mg/h | INTRAVENOUS | Status: DC
  Administered 2020-08-30: 2 mg/h via INTRAVENOUS
  Administered 2020-08-30: 3 mg/h via INTRAVENOUS
  Filled 2020-08-30 (×2): qty 50

## 2020-08-30 MED ORDER — MIDAZOLAM HCL 2 MG/2ML IJ SOLN
1.0000 mg | INTRAMUSCULAR | Status: DC | PRN
Start: 2020-08-30 — End: 2020-08-31

## 2020-08-30 MED ORDER — ACETAMINOPHEN 650 MG RE SUPP: 650.0000 mg | Freq: Four times a day (QID) | RECTAL | Status: DC | PRN

## 2020-08-30 MED ORDER — NOREPINEPHRINE 16 MG/250ML-% IV SOLN
0.0000 ug/min | INTRAVENOUS | Status: DC
  Administered 2020-08-30: 60 ug/min via INTRAVENOUS
  Administered 2020-08-30: 84 ug/min via INTRAVENOUS
  Administered 2020-08-30: 74 ug/min via INTRAVENOUS
  Filled 2020-08-30 (×3): qty 250

## 2020-08-30 MED ORDER — ACETAMINOPHEN 325 MG PO TABS: 325.0000 mg | ORAL_TABLET | ORAL | Status: DC | PRN

## 2020-08-30 MED ORDER — FENTANYL CITRATE (PF) 100 MCG/2ML IJ SOLN
50.0000 ug | INTRAMUSCULAR | Status: DC | PRN
Start: 2020-08-30 — End: 2020-08-31

## 2020-08-30 MED ORDER — COAGULATION FACTOR VIIA RECOMB 1 MG IV SOLR
1.0000 mg | Freq: Once | INTRAVENOUS | Status: AC
  Administered 2020-08-30: 1 mg via INTRAVENOUS
  Filled 2020-08-30: qty 1

## 2020-08-30 MED ORDER — NOREPINEPHRINE 16 MG/250ML-% IV SOLN
0.0000 ug/min | INTRAVENOUS | Status: DC
  Administered 2020-08-30: 10 ug/min via INTRAVENOUS
  Filled 2020-08-30: qty 250

## 2020-08-30 MED ORDER — DIPHENHYDRAMINE HCL 50 MG/ML IJ SOLN: 25.0000 mg | INTRAMUSCULAR | Status: DC | PRN

## 2020-08-30 MED ORDER — SODIUM CHLORIDE 0.9% FLUSH: 10.0000 mL | Freq: Two times a day (BID) | INTRAVENOUS | Status: DC

## 2020-08-30 MED ORDER — SODIUM CHLORIDE 0.9% FLUSH: 10.0000 mL | INTRAVENOUS | Status: DC | PRN

## 2020-08-30 MED ORDER — CALCIUM GLUCONATE-NACL 2-0.675 GM/100ML-% IV SOLN
2.0000 g | Freq: Once | INTRAVENOUS | Status: AC
  Administered 2020-08-30: 2000 mg via INTRAVENOUS
  Filled 2020-08-30: qty 100

## 2020-08-30 MED ORDER — ACETAMINOPHEN 325 MG RE SUPP: 325.0000 mg | RECTAL | Status: DC | PRN

## 2020-08-30 MED ORDER — LEVALBUTEROL HCL 0.63 MG/3ML IN NEBU
0.6300 mg | INHALATION_SOLUTION | Freq: Four times a day (QID) | RESPIRATORY_TRACT | Status: DC
  Administered 2020-08-30: 0.63 mg via RESPIRATORY_TRACT
  Filled 2020-08-30: qty 3

## 2020-08-31 LAB — BPAM FFP
Blood Product Expiration Date: 202203092359
Blood Product Expiration Date: 202203092359
Blood Product Expiration Date: 202203092359
Blood Product Expiration Date: 202203102359
Blood Product Expiration Date: 202203102359
Blood Product Expiration Date: 202203102359
Blood Product Expiration Date: 202203102359
Blood Product Expiration Date: 202203132359
Blood Product Expiration Date: 202203132359
Blood Product Expiration Date: 202203132359
Blood Product Expiration Date: 202203132359
Blood Product Expiration Date: 202203132359
Blood Product Expiration Date: 202203132359
Blood Product Expiration Date: 202203132359
Blood Product Expiration Date: 202203132359
Blood Product Expiration Date: 202203182359
Blood Product Expiration Date: 202203192359
Blood Product Expiration Date: 202203192359
Blood Product Expiration Date: 202203202359
Blood Product Expiration Date: 202203202359
Blood Product Expiration Date: 202203202359
Blood Product Expiration Date: 202203272359
Blood Product Expiration Date: 202203272359
Blood Product Expiration Date: 202204012359
Blood Product Expiration Date: 202203102359
Blood Product Expiration Date: 202203102359
Blood Product Expiration Date: 202203092359
Blood Product Expiration Date: 202203092359
Blood Product Expiration Date: 202203132359
Blood Product Expiration Date: 202203132359
ISSUE DATE / TIME: 202203081811
ISSUE DATE / TIME: 202203081811
ISSUE DATE / TIME: 202203081814
ISSUE DATE / TIME: 202203081815
ISSUE DATE / TIME: 202203081819
ISSUE DATE / TIME: 202203081819
ISSUE DATE / TIME: 202203081819
ISSUE DATE / TIME: 202203081819
ISSUE DATE / TIME: 202203081901
ISSUE DATE / TIME: 202203081901
ISSUE DATE / TIME: 202203081944
ISSUE DATE / TIME: 202203081944
ISSUE DATE / TIME: 202203082024
ISSUE DATE / TIME: 202203082024
ISSUE DATE / TIME: 202203082024
ISSUE DATE / TIME: 202203082024
ISSUE DATE / TIME: 202203082314
ISSUE DATE / TIME: 202203090506
ISSUE DATE / TIME: 202203090506
ISSUE DATE / TIME: 202203090833
ISSUE DATE / TIME: 202203090833
ISSUE DATE / TIME: 202203090834
ISSUE DATE / TIME: 202203091334
ISSUE DATE / TIME: 202203091334
ISSUE DATE / TIME: 202203090833
ISSUE DATE / TIME: 202203090833
ISSUE DATE / TIME: 202203091334
ISSUE DATE / TIME: 202203091334
ISSUE DATE / TIME: 202203091334
ISSUE DATE / TIME: 202203091334
Unit Type and Rh: 5100
Unit Type and Rh: 5100
Unit Type and Rh: 5100
Unit Type and Rh: 5100
Unit Type and Rh: 600
Unit Type and Rh: 600
Unit Type and Rh: 6200
Unit Type and Rh: 6200
Unit Type and Rh: 6200
Unit Type and Rh: 6200
Unit Type and Rh: 6200
Unit Type and Rh: 6200
Unit Type and Rh: 6200
Unit Type and Rh: 6200
Unit Type and Rh: 6200
Unit Type and Rh: 6200
Unit Type and Rh: 6200
Unit Type and Rh: 6200
Unit Type and Rh: 6200
Unit Type and Rh: 6200
Unit Type and Rh: 7300
Unit Type and Rh: 7300
Unit Type and Rh: 7300
Unit Type and Rh: 7300
Unit Type and Rh: 7300
Unit Type and Rh: 7300
Unit Type and Rh: 600
Unit Type and Rh: 600
Unit Type and Rh: 6200
Unit Type and Rh: 6200

## 2020-08-31 LAB — PREPARE FRESH FROZEN PLASMA
Unit division: 0
Unit division: 0
Unit division: 0
Unit division: 0
Unit division: 0
Unit division: 0
Unit division: 0
Unit division: 0
Unit division: 0
Unit division: 0
Unit division: 0
Unit division: 0
Unit division: 0

## 2020-08-31 LAB — PREPARE CRYOPRECIPITATE
Unit division: 0
Unit division: 0
Unit division: 0
Unit division: 0

## 2020-08-31 LAB — BPAM PLATELET PHERESIS
Blood Product Expiration Date: 202203112359
Blood Product Expiration Date: 202203112359
Blood Product Expiration Date: 202203112359
ISSUE DATE / TIME: 202203090526
ISSUE DATE / TIME: 202203091338
ISSUE DATE / TIME: 202203091338
Unit Type and Rh: 7300
Unit Type and Rh: 600
Unit Type and Rh: 7300

## 2020-08-31 LAB — PREPARE PLATELET PHERESIS
Unit division: 0
Unit division: 0
Unit division: 0

## 2020-08-31 LAB — BPAM CRYOPRECIPITATE
Blood Product Expiration Date: 202203090640
Blood Product Expiration Date: 202203090640
Blood Product Expiration Date: 202203091430
Blood Product Expiration Date: 202203091430
ISSUE DATE / TIME: 202203090058
ISSUE DATE / TIME: 202203090058
ISSUE DATE / TIME: 202203090907
ISSUE DATE / TIME: 202203091045
Unit Type and Rh: 5100
Unit Type and Rh: 5100
Unit Type and Rh: 5100
Unit Type and Rh: 5100

## 2020-08-31 MED FILL — Sodium Chloride IV Soln 0.9%: INTRAVENOUS | Qty: 1000 | Status: AC

## 2020-08-31 MED FILL — Sodium Chloride Irrigation Soln 0.9%: Qty: 3000 | Status: AC

## 2020-08-31 MED FILL — Heparin Sodium (Porcine) Inj 1000 Unit/ML: INTRAMUSCULAR | Qty: 30 | Status: AC

## 2020-09-01 LAB — BPAM RBC
Blood Product Expiration Date: 202204042359
Blood Product Expiration Date: 202204052359
Blood Product Expiration Date: 202204052359
Blood Product Expiration Date: 202204052359
Blood Product Expiration Date: 202204052359
Blood Product Expiration Date: 202204052359
Blood Product Expiration Date: 202204052359
Blood Product Expiration Date: 202204052359
Blood Product Expiration Date: 202204072359
Blood Product Expiration Date: 202204072359
Blood Product Expiration Date: 202204072359
Blood Product Expiration Date: 202204072359
Blood Product Expiration Date: 202204072359
Blood Product Expiration Date: 202204072359
Blood Product Expiration Date: 202204082359
Blood Product Expiration Date: 202204082359
Blood Product Expiration Date: 202204082359
Blood Product Expiration Date: 202204082359
Blood Product Expiration Date: 202204082359
Blood Product Expiration Date: 202204082359
Blood Product Expiration Date: 202204082359
Blood Product Expiration Date: 202204082359
Blood Product Expiration Date: 202204082359
Blood Product Expiration Date: 202204082359
ISSUE DATE / TIME: 202203081802
ISSUE DATE / TIME: 202203081802
ISSUE DATE / TIME: 202203081811
ISSUE DATE / TIME: 202203081819
ISSUE DATE / TIME: 202203081819
ISSUE DATE / TIME: 202203081858
ISSUE DATE / TIME: 202203090505
ISSUE DATE / TIME: 202203090831
ISSUE DATE / TIME: 202203082314
ISSUE DATE / TIME: 202203082314
ISSUE DATE / TIME: 202203090305
ISSUE DATE / TIME: 202203090305
ISSUE DATE / TIME: 202203090505
ISSUE DATE / TIME: 202203090505
ISSUE DATE / TIME: 202203090831
ISSUE DATE / TIME: 202203090831
ISSUE DATE / TIME: 202203091329
ISSUE DATE / TIME: 202203091329
ISSUE DATE / TIME: 202203091344
ISSUE DATE / TIME: 202203091534
ISSUE DATE / TIME: 202203091540
ISSUE DATE / TIME: 202203101208
ISSUE DATE / TIME: 202203101241
Unit Type and Rh: 5100
Unit Type and Rh: 5100
Unit Type and Rh: 5100
Unit Type and Rh: 5100
Unit Type and Rh: 5100
Unit Type and Rh: 5100
Unit Type and Rh: 5100
Unit Type and Rh: 5100
Unit Type and Rh: 5100
Unit Type and Rh: 5100
Unit Type and Rh: 5100
Unit Type and Rh: 5100
Unit Type and Rh: 5100
Unit Type and Rh: 5100
Unit Type and Rh: 5100
Unit Type and Rh: 5100
Unit Type and Rh: 5100
Unit Type and Rh: 5100
Unit Type and Rh: 5100
Unit Type and Rh: 5100
Unit Type and Rh: 5100
Unit Type and Rh: 5100

## 2020-09-01 LAB — TYPE AND SCREEN
ABO/RH(D): O POS
Antibody Screen: NEGATIVE
Antibody Screen: NEGATIVE
Unit division: 0
Unit division: 0
Unit division: 0
Unit division: 0
Unit division: 0
Unit division: 0
Unit division: 0
Unit division: 0
Unit division: 0
Unit division: 0
Unit division: 0
Unit division: 0
Unit division: 0
Unit division: 0
Unit division: 0
Unit division: 0
Unit division: 0
Unit division: 0
Unit division: 0
Unit division: 0
Unit division: 0
Unit division: 0
Unit division: 0
Unit division: 0
Unit division: 0

## 2020-09-22 NOTE — Progress Notes (Signed)
  eLink Physician-Brief Progress Note Patient Name: Clayton Wright DOB: 07/03/1960 MRN: [id number]   Date of Service  03-Sep-2020  HPI/Events of Note  ABG on 50%/SIMV 20/PS ?/TV 600/P 5 = 7.190/39.0/166  eICU Interventions  Plan: 1. NaHCO3 100 meq IV now. 2. NaHCO3 IV infusion to run at 75 mL/hour.  3. Repeat ABG at 5 AM.     Intervention Category Major Interventions: Acid-Base disturbance - evaluation and management;Respiratory failure - evaluation and management  Lenell Antu Sep 03, 2020, 12:47 AM

## 2020-09-22 NOTE — Progress Notes (Signed)
Chaplain was called to provide emotional and spiritual support to family of patient as he is critically ill and is on DNR. Chaplain responded and spoke with wife, sister and brother who were at bedside. They requested prayer and also a Zorita Pang for last rites. Chaplain prayed and contacted Father Ree Kida who will come and give last rites this morning. Chaplain is available when needed.    Sep 24, 2020 1000  Clinical Encounter Type  Visited With Patient and family together  Visit Type Critical Care  Referral From Nurse  Consult/Referral To Chaplain  Spiritual Encounters  Spiritual Needs Emotional;Prayer  Stress Factors  Patient Stress Factors Major life changes  Family Stress Factors Loss

## 2020-09-22 NOTE — Progress Notes (Signed)
   VASCULAR SURGERY ASSESSMENT & PLAN:   POD 1 RAAA: The patient remains hypotensive on multiple pressors and significantly coagulopathic.  He is having significant output from his abdominal VAC however I do not see any utility in returning him to the operating room until his coagulopathy is corrected.  His INR is 2.7 and his platelet count is 63,000.  Appreciate critical care medicine's help.  Would continue to aggressively try to correct his coagulopathy.  RENAL: His creatinine is up to 2.4.  He is an uric.  If he shows some clinical improvement then nephrology will need to be consulted.  CARDIAC: According to the family he has no significant cardiac history.  However, he does not see a physician and is not on any medications.  The family states that he is a heavy smoker at least a pack a day.  ABDOMINAL VAC: If his coagulopathy can be corrected that he will require return to the operating room for exploration and replacement of the abdominal VAC.  VASCULAR: He does have good Doppler signals in both feet.  NEURO: He has been waking up some and is moving all extremities.  SUBJECTIVE:   Sedated on the vent.  PHYSICAL EXAM:   Vitals:   09/04/2020 0315 08/29/2020 0330 09/12/2020 0345 09/04/2020 0522  BP:      Pulse: (!) 109 (!) 104 (!) 105   Resp: 20 18 20    Temp: (!) 93.74 F (34.3 C) (!) 93.74 F (34.3 C) (!) 93.56 F (34.2 C)   TempSrc: Core     SpO2: 100% 100% 100% 100%  Weight:      Height:       Good Doppler signals in both feet. Abdominal VAC in place.  LABS:   Lab Results  Component Value Date   WBC 20.0 (H) 09/09/2020   HGB 7.8 (L) 09/05/2020   HCT 23.0 (L) 09/14/2020   MCV 91.8 09/10/2020   PLT 64 (L) 08/29/2020   PLT 63 (L) 08/28/2020   Lab Results  Component Value Date   CREATININE 2.44 (H) 09/18/2020   Lab Results  Component Value Date   INR 2.7 (H) 08/24/2020   CBG (last 3)  Recent Labs    09/18/2020 0424  GLUCAP 308*    PROBLEM LIST:    Principal  Problem:   Ruptured abdominal aortic aneurysm (AAA) (HCC) Active Problems:   Cardiac arrest (HCC)   Hypokalemia   Leukocytosis   AKI (acute kidney injury) (HCC)   Hemorrhagic shock (HCC)   Lactic acidosis   Endotracheally intubated   CURRENT MEDS:   . sodium chloride   Intravenous Once  . sodium chloride   Intravenous Once  . sodium chloride   Intravenous Once  . sodium chloride   Intravenous Once  . sodium chloride   Intravenous Once  . docusate  100 mg Per Tube BID  . insulin aspart  0-15 Units Subcutaneous Q4H  . LORazepam      . pantoprazole (PROTONIX) IV  40 mg Intravenous QHS  . polyethylene glycol  17 g Per Tube Daily    10/30/20 Office: [phone] 08/23/2020

## 2020-09-22 NOTE — Progress Notes (Signed)
OT Cancellation Note  Patient Details Name: Clayton Wright MRN: [id number] DOB: 03/22/1961   Cancelled Treatment:    Reason Eval/Treat Not Completed: Patient not medically ready.  Per PT, order for acute rehab canceled, orders will be placed when patient is medically stable.    Cheyenne Bordeaux D Laurielle Selmon 08/29/2020, 9:39 AM

## 2020-09-22 NOTE — Addendum Note (Signed)
Addendum  created 2020/09/24 0903 by Adair Laundry, CRNA   Order list changed, Pharmacy for encounter modified

## 2020-09-22 NOTE — Plan of Care (Signed)
  Problem: Education: Goal: Knowledge of General Education information will improve Description: Including pain rating scale, medication(s)/side effects and non-pharmacologic comfort measures Outcome: Not Progressing   Problem: Health Behavior/Discharge Planning: Goal: Ability to manage health-related needs will improve Outcome: Progressing   Problem: Clinical Measurements: Goal: Ability to maintain clinical measurements within normal limits will improve Outcome: Not Progressing Goal: Will remain free from infection Outcome: Progressing Goal: Diagnostic test results will improve Outcome: Progressing Goal: Respiratory complications will improve Outcome: Progressing Goal: Cardiovascular complication will be avoided Outcome: Progressing   Problem: Activity: Goal: Risk for activity intolerance will decrease Outcome: Not Progressing   Problem: Nutrition: Goal: Adequate nutrition will be maintained Outcome: Not Progressing   Problem: Coping: Goal: Level of anxiety will decrease Outcome: Not Progressing   Problem: Elimination: Goal: Will not experience complications related to bowel motility Outcome: Not Progressing Goal: Will not experience complications related to urinary retention Outcome: Not Progressing   Problem: Pain Managment: Goal: General experience of comfort will improve Outcome: Progressing

## 2020-09-22 NOTE — Progress Notes (Signed)
eLink Physician-Brief Progress Note Patient Name: Clayton Wright DOB: December 19, 1960 MRN: [id number]   Date of Service  09/02/2020  HPI/Events of Note  Multiple issues: 1. ABG on 50%/PRVC 20/TV 600/P 5 = 7.188/37.2/152, 2. INR = 2.7, PTT = 113 and Fibrinogen = 160. 3. Ionized Ca++ = 1.0   eICU Interventions  Plan: 1. NaHCO3 100 meq IV now. 2. Repeat ABG at 8 AM. 3. Transfuse 3 units FFP now. 4.  Repeat DIC Panel at 10 AM. 5.  Replace Ca++.     Intervention Category Major Interventions: Other:;Electrolyte abnormality - evaluation and management  Sommer,Steven Dennard Nip 09/07/2020, 4:51 AM

## 2020-09-22 NOTE — Progress Notes (Signed)
Since arrival to unit, patient had significant output from surgical sites including abd wound vac and from NGT. Pt also very hemodynamically unstable with use of bicarb pushes, IV fluid bolus, levo and vaso gtts in attempt to maintain BP.   Multiple MDs at bedside at various times throughout the night including Dr. Edilia Bo, Dr. Ardeth Perfect, and Dr. Janyth Contes. All MDs informed of patient status throughout the night as needed to for new orders with changes in patient status.   Transfusion totals include: RBCx8, FFPx4, Pltx2, Cryox2.   All pt outputs and critical lab values reported to MDs throughout the night.   Goals for the night were to: correct coagulopathy and increase patient body temp. Orders for correction were placed by MDs and bair hugger was used to increase patient temp.   Pt remains very tenuous and hemodynamically unstable. MD and RN both updated patient's wife.

## 2020-09-22 NOTE — Progress Notes (Signed)
  Echocardiogram 2D Echocardiogram has been performed.  Janalyn Harder 09-09-20, 10:46 AM

## 2020-09-22 NOTE — Death Summary Note (Signed)
Patient ID: Clayton Wright MRN: [id number] DOB/AGE: 1961/06/23 60 y.o.  Admit date: 09/05/2020 Date of death: September 07, 2020  Admission Diagnosis: Ruptured abdominal aortic aneurysm (AAA) (HCC) [I71.3] AAA (abdominal aortic aneurysm, ruptured) (HCC) [I71.3]  Final Diagnoses:  Ruptured abdominal aortic aneurysm (AAA) (HCC) [I71.3] AAA (abdominal aortic aneurysm, ruptured) (HCC) [I71.3]  Secondary Diagnoses: Past Medical History:  Diagnosis Date  . COPD (chronic obstructive pulmonary disease) (HCC)   . Pneumonia    02/2013   Procedures: 2020-09-05 Surgeon(s): Clayton Hint, MD Procedure(s): ANEURYSM ABDOMINAL AORTIC REPAIR  HPI: This patient presented to the emergency department on September 05, 2020 approximately 5:30 with hypotension and abdominal pain.  CT scan showed a ruptured abdominal aortic aneurysm.  Was not felt to be candidate for endovascular repair and he was hemodynamically unstable with a pressure of 60.  He was taken urgently to the operating room for open repair.  Hospital Course: The patient was taken urgently to the operating room.  While lines were being placed by anesthesia he was noted to have no pulse and chest compressions were started.  Therefore the abdomen was quickly prepped and draped in usual sterile fashion and the abdomen entered through a midline incision.  There was free intraperitoneal blood.  I was able to dissected up to the renal vein and digitally control the aorta just below the renal arteries and a DeBakey clamp was placed and the patient was stabilized.  Distal control was obtained and the aneurysm was repaired with an aortobiiliac graft.  The patient had received massive blood transfusion and was clearly coagulopathic bleeding from everywhere.  Anesthesia aggressively try to correct his coagulopathy and an abdominal VAC was placed and the patient was transferred to the ICU in critical condition.  He was supported with maximum pressors and attempts were  made to correct his coagulopathy however he continued to remain hemodynamically unstable and his coagulopathy could not be corrected.  I did not think there was any utility in returning him to the operating room unless we can correct his coagulopathy.  After discussion with the family critical care medicine ultimately they decided to proceed with comfort measures.  The patient passed at 6:50 PM on 09-06-20.  Consults:    Significant Diagnostic Studies:  DG Chest Port 1 View  Result Date: Sep 06, 2020 CLINICAL DATA:  Intubation.  Status post ruptured AAA repair. EXAM: PORTABLE CHEST 1 VIEW COMPARISON:  05-Sep-2020. FINDINGS: Endotracheal tube, NG tube, left IJ sheath in stable position. Heart size stable bibasilar atelectasis noted. Low lung volumes with mild bibasilar infiltrates cannot be excluded. No pleural effusion or pneumothorax. IMPRESSION: 1. Lines and tubes in stable position. 2. Low lung volumes with bibasilar atelectasis. Mild bibasilar infiltrates cannot be excluded. Electronically Signed   By: Maisie Fus  Register   On: 2020/09/06 06:17   DG Chest Port 1 View  Result Date: 2020/09/05 CLINICAL DATA:  Status post abdominal aortic repair EXAM: PORTABLE CHEST 1 VIEW COMPARISON:  None. FINDINGS: Cardiac shadow is within normal limits. Endotracheal tube and gastric catheter are noted in satisfactory position. Elevation the right hemidiaphragm is seen. The lungs are clear. Jugular sheath is noted on the left IMPRESSION: No acute abnormality noted. Tubes and lines as described. Electronically Signed   By: Alcide Clever M.D.   On: 09-05-20 23:42   DG Abd Portable 1V  Result Date: 05-Sep-2020 CLINICAL DATA:  Status post abdominal aortic repair EXAM: PORTABLE ABDOMEN - 1 VIEW COMPARISON:  None. FINDINGS: Scattered large and small bowel gas is noted. Gastric catheter  is noted within the stomach. No free air is seen. No bony abnormality is noted. IMPRESSION: Gastric catheter within the stomach.  No acute  abnormality noted. Electronically Signed   By: Alcide CleverMark  Lukens M.D.   On: 02-18-2021 23:43   ECHOCARDIOGRAM LIMITED  Result Date: 08/31/2020    ECHOCARDIOGRAM LIMITED REPORT   Patient Name:   Clayton Wright Date of Exam: 09/04/2020 Medical Rec #:  [id number]        Height:       71.0 in Accession #:    [id number]       Weight:       262.8 lb Date of Birth:  [birth year]        BSA:          2.368 m Patient Age:    59 years         BP:           70/57 mmHg Patient Gender: M                HR:           138 bpm. Exam Location:  Inpatient Procedure: Limited Echo, Cardiac Doppler and Color Doppler Indications:    I49.8 Other specified cardiac arrhythmias. Cardiac arrest.  History:        Patient has no prior history of Echocardiogram examinations.                 Abnormal ECG; Arrythmias:Cardiac Arrest. Ruptured abdominal                 aorta.  Sonographer:    Sheralyn Boatmanina West RDCS Referring Phys: [id number] Tobey GrimKATALINA M EUBANKS  Sonographer Comments: Technically difficult study due to poor echo windows, patient is morbidly obese and echo performed with patient supine and on artificial respirator. Image acquisition challenging due to patient body habitus. Abdominal wound in subcostal region. IMPRESSIONS  1. Left ventricular ejection fraction, by estimation, is 70 to 75%. The left ventricle has hyperdynamic function.  2. Right ventricular systolic function is hyperdynamic. The right ventricular size is normal.  3. A small pericardial effusion is present. The pericardial effusion is circumferential.  4. The mitral valve is normal in structure. No evidence of mitral valve regurgitation.  5. The aortic valve is tricuspid. Aortic valve regurgitation is not visualized. No aortic stenosis is present.  6. There is mild dilatation of the aortic root, measuring 40 mm. No dilation at the ascending aorta or aortic arch.  7. Technically challenging study; consider repeat study with improvement in heart rate. Comparison(s): No prior Echocardiogram.  FINDINGS  Left Ventricle: Left ventricular ejection fraction, by estimation, is 70 to 75%. The left ventricle has hyperdynamic function. Right Ventricle: The right ventricular size is normal. Right vetricular wall thickness was not well visualized. Right ventricular systolic function is hyperdynamic. Pericardium: A small pericardial effusion is present. The pericardial effusion is circumferential. Mitral Valve: The mitral valve is normal in structure. Aortic Valve: The aortic valve is tricuspid. Aortic valve regurgitation is not visualized. No aortic stenosis is present. Pulmonic Valve: The pulmonic valve was not well visualized. Pulmonic valve regurgitation is not visualized. Aorta: There is mild dilatation of the aortic root, measuring 40 mm. LEFT VENTRICLE PLAX 2D LVIDd:         3.50 cm LVIDs:         2.20 cm LV PW:         1.20 cm LV IVS:        1.50 cm  LEFT ATRIUM         Index LA diam:    1.90 cm 0.80 cm/m   AORTA Ao Root diam: 4.00 cm Riley Lam MD Electronically signed by Riley Lam MD Signature Date/Time: 09/06/2020/11:57:33 AM    Final    CT Angio Abd/Pel w/ and/or w/o  Result Date: 09/16/2020 CLINICAL DATA:  Unresponsive, abdominal pain EXAM: CTA ABDOMEN AND PELVIS WITHOUT AND WITH CONTRAST TECHNIQUE: Multidetector CT imaging of the abdomen and pelvis was performed using the standard protocol during bolus administration of intravenous contrast. Multiplanar reconstructed images and MIPs were obtained and reviewed to evaluate the vascular anatomy. CONTRAST:  OMNIPAQUE IOHEXOL 350 MG/ML SOLN COMPARISON:  None. FINDINGS: VASCULAR Aorta: There is a 7 cm ruptured infrarenal abdominal aortic aneurysm, with site of rupture identified in the right posterolateral aorta image 102/5. Large associated retroperitoneal hematoma. Extensive mural thrombus within the aortic aneurysm. Celiac: Patent without evidence of aneurysm, dissection, vasculitis or significant stenosis. SMA: Patent without  evidence of aneurysm, dissection, vasculitis or significant stenosis. Renals: Both renal arteries are patent without evidence of aneurysm, dissection, vasculitis, fibromuscular dysplasia or significant stenosis. IMA: Not identified. Inflow: Incompletely evaluated due to extravasation of contrast from ruptured infrarenal abdominal aortic aneurysm. Minimal contrast opacification identified with no evidence of occlusion. Proximal Outflow: Limited evaluation due to incomplete contrast opacification, with no gross abnormalities. Veins: Right femoral catheter, tip extending to the right external iliac vein. Remaining venous structures are incompletely evaluated due to the arterial phase of contrast enhancement. Review of the MIP images confirms the above findings. NON-VASCULAR Lower chest: Hypoventilatory changes at the lung bases. No acute pleural or parenchymal lung disease. Hepatobiliary: No focal liver abnormality is seen. No gallstones, gallbladder wall thickening, or biliary dilatation. Pancreas: Unremarkable. No pancreatic ductal dilatation or surrounding inflammatory changes. Spleen: Normal in size without focal abnormality. Adrenals/Urinary Tract: No gross renal abnormalities. There is displacement of the right kidney anteriorly by the large right retroperitoneal hematoma from the ruptured infrarenal abdominal aortic aneurysm. Bilateral adrenal gland thickening with low attenuation consistent with adenomas or hyperplasia. Bladder is decompressed with a Foley catheter. Stomach/Bowel: No bowel obstruction or ileus. No bowel wall thickening or inflammatory change. Lymphatic: No pathologic adenopathy. Reproductive: Prostate is unremarkable. Other: There is a large right retroperitoneal hematoma, measuring up to 8.0 x 12.2 cm in transverse direction, and extending approximately 20 cm in craniocaudal length. Trace free fluid within the peritoneal cavity. No free gas. No abdominal wall hernia. Musculoskeletal: No acute  or destructive bony lesions. Reconstructed images demonstrate no additional findings. IMPRESSION: VASCULAR 1. Ruptured 7 cm infrarenal abdominal aortic aneurysm, with a rent identified in the right posterolateral aspect of the infrarenal aorta as above. Large retroperitoneal hematoma, right greater than left, with displacement of the right kidney as result. NON-VASCULAR 1. Large retroperitoneal hematoma as above. 2. Trace free fluid within the pelvis. Critical Value/emergent results were called by telephone at the time of interpretation on 16-Sep-2020 at 7:09 pm to provider [facility] , who verbally acknowledged these results. Electronically Signed   By: Sharlet Salina M.D.   On: September 16, 2020 19:15   DG OR LOCAL ABDOMEN  Result Date: 2020/09/16 CLINICAL DATA:  Operative count EXAM: OR LOCAL ABDOMEN COMPARISON:  CT 2020-09-16 FINDINGS: Catheter tubing coursing from the lower margin of imaging to the patient's midline likely reflecting a wound VAC. Tip of a transesophageal tube projects over the left upper quadrant. Surgical appearance of the abdomen is noted. No unexpected radiopaque foreign body or other  concerning abnormality. IMPRESSION: No unexpected radiopaque foreign body. These results were called by telephone to the operating room at the time of interpretation on 08/25/2020 at 10:10 pm to provider RN Andria Meuse, who verbally acknowledged these results and will relay the findings to the operative provider. Electronically Signed   By: Kreg Shropshire M.D.   On: 09/16/2020 22:10    CBC    Component Value Date/Time   WBC 17.6 (H) 2020-09-27 1300   RBC 2.24 (L) 09-27-20 1300   HGB 7.8 (L) 09/27/20 1445   HCT 23.0 (L) 2020-09-27 1445   PLT 38 (L) Sep 27, 2020 1300   PLT 39 (L) 09-27-2020 1300   MCV 88.8 27-Sep-2020 1300   MCH 31.3 2020/09/27 1300   MCHC 35.2 September 27, 2020 1300   RDW 14.4 09-27-20 1300    BMET    Component Value Date/Time   NA 142 09-27-20 1445   K 4.6 09/27/20 1445   CL 103  09-27-20 1300   CO2 17 (L) 09-27-2020 1300   GLUCOSE 137 (H) 2020/09/27 1300   BUN 15 September 27, 2020 1300   CREATININE 3.29 (H) September 27, 2020 1300   CALCIUM 8.5 (L) 2020/09/27 1300   GFRNONAA 21 (L) 27-Sep-2020 1300    COAG Lab Results  Component Value Date   INR 2.6 (H) 2020-09-27   INR 2.6 (H) 09/27/20   INR 3.0 (H) 2020/09/27          Signed: Waverly Ferrari 08/31/2020, 7:09 AM

## 2020-09-22 NOTE — Progress Notes (Signed)
PT Cancellation Note  Patient Details Name: Clayton Wright MRN: [id number] DOB: September 22, 1960   Cancelled Treatment:    Reason Eval/Treat Not Completed: Patient not medically ready. Pt medically unstable and not appropriate for therapy at this time. PT order cancelled. Please re-consult if pt becomes appropriate to progress mobility.  Lewis Shock, PT, DPT Acute Rehabilitation Services Pager #: [phone] Office #: [phone]    Iona Hansen September 09, 2020, 9:35 AM

## 2020-09-22 NOTE — Progress Notes (Incomplete)
Initial Nutrition Assessment  DOCUMENTATION CODES:      INTERVENTION:  ***   NUTRITION DIAGNOSIS:     related to   as evidenced by  .  ***  GOAL:      ***  MONITOR:      REASON FOR ASSESSMENT:   Ventilator    ASSESSMENT:   Clayton Wright admitted for ruptured abdominal aortic aneurysm. Pt was taken to OR for repair with bypass graft and placement of abdominal VAC, while in OR with cardiac arrest (3/08). PMH of COPD and pneumonia.  ***   Patient is currently intubated on ventilator support MV: *** L/min Temp (24hrs), Avg:95 F (35 C), Min:93.56 F (34.2 C), Max:99 F (37.2 C)  Propofol: *** ml/hr   Pt's weights reviewed and no weight history noted.  Admit weight: 262.79#   Meds reviewed: Colace (BID), Miralax (daily), Levophed, Vasopressin  Labs reviewed: CBG (009-381)  UOP: 0 mL x 24 hours  VAC output: 3450 mL x 24 hours, 950 mL output so far this morning I&O's reviewed: +10,224 L since admission   NUTRITION - FOCUSED PHYSICAL EXAM:  {RD Focused Exam List:21252}  Diet Order:   Diet Order            Diet NPO time specified  Diet effective now                 EDUCATION NEEDS:      Skin:  Skin Assessment: Skin Integrity Issues: Incisions: closed - abdomen  Last BM:  unknown.  Height:   Ht Readings from Last 1 Encounters:  08/24/2020 5\' 11"  (1.803 m)    Weight:   Wt Readings from Last 1 Encounters:  09/02/2020 119.2 kg    Ideal Body Weight:  78.2 kg  BMI:  Body mass index is 36.65 kg/m.  Estimated Nutritional Needs:   Kcal:     Protein:     Fluid:       ***

## 2020-09-22 NOTE — Significant Event (Addendum)
I had a lengthy discussion with patient's family (wife, sister) and updated them on [name]'s progress.  I made them aware that his prognosis was grave and he would most likely not survive this hospital stay.  His recent labs showed persistently elevated lactic acid greater than 11, worsening renal function, worsening mentation, bleeding from IV site as well as his surgical incision site.  DIC panel showed persistent coagulation abnormality that has been resistant to blood products including packed red blood cells, FFP, cryoprecipitate, recombinant factor VIIa.  In addition, he remains on multiple pressors for hemodynamic support despite increasing the ceiling of his vasopressors. He has received about ~39 blood products so far without making any progress.  Given his grave prognosis, the family has elected to proceed to comfort care and currently waiting on patient's daughter to come to bedside.  I have updated vascular surgery who agree with the family's decision.\  Patient passed peacefully at 6:50 pm  Jodelle Red, MD IMTS PGY-3

## 2020-09-22 NOTE — Progress Notes (Signed)
Pt BP cont to deteriorate despite norepi and vaso 0.04.  Increased vaso to 0.05 at this time.   pH on abg 7.2 with bicarb of 15 2 amps of bicarb Increased gtt to 244ml/hr  Given additional calcium as well Bolus 1L NS  Pt starting to ooze from other puncture sites. cont'd output in wound vac (no slowing noted) and expanding blood under dressing of wound vac that has since been reinforced.   Called surgery to bedside ~40-60 mins ago due to same, no immediate plans to return to surgery. Recommend continued transfusions and dressing reinforcement.    Awaiting post transfusion labs that were just completed despite stable numbers not needing replacement (except INR 3).   Fibro was >100 hgb >8 plts >50 INR 3  Gave cryo, prbc, plts and ffp.   Once labs are back will determine what other factors to given but in interim with new bleeding at all puncture sites will start with prbc and ffp (knowing the previous inr was 3)  Communicated to bedside RN

## 2020-09-22 NOTE — Anesthesia Postprocedure Evaluation (Signed)
Anesthesia Post Note  Patient: Clayton Wright  Procedure(s) Performed: ANEURYSM ABDOMINAL AORTIC REPAIR (N/A Abdomen)     Patient location during evaluation: ICU Anesthesia Type: General Level of consciousness: sedated Pain management: pain level controlled Vital Signs Assessment: post-procedure vital signs reviewed and stable Respiratory status: patient remains intubated per anesthesia plan Cardiovascular status: stable Postop Assessment: no apparent nausea or vomiting Anesthetic complications: no   No complications documented.  Last Vitals:  Vitals:   08/28/2020 1830 08/29/2020 2229  BP: (!) 51/29 (!) 163/78  Pulse:  (!) 110  Resp: (!) 30 19  Temp: 37.2 C   SpO2:  100%    Last Pain: There were no vitals filed for this visit.               Clayton Wright P Clayton Wright

## 2020-09-22 NOTE — Progress Notes (Addendum)
Goals of care conversation had at this time with pt's family (wife and sister). Who agree that at this time dnr is appropriate. We can readdress as perhaps improvement is made if possible.   rn at bedside  Code status changed to DNR Cont aggressive care otherwise.   Additional 32 mins spent addressing advanced care planning.

## 2020-09-22 NOTE — Progress Notes (Signed)
eLink Physician-Brief Progress Note Patient Name: Clayton Wright DOB: [birth year] MRN: [id number]   Date of Service  09-11-20  HPI/Events of Note  Hypotension - BP = 73/64 with MAP = 96. Hgb = 5.8. and ABG on 50%/PRVC 20 /TV 600/P 5 = 7.218/34.9/191.  eICU Interventions  Plan: 1. Increase ceiling on Norepinephrine IV infusion to 60 mcg/min.  2. NaHCO3 100 meq IV now. 3. Increase NaHCO3 IV infusion to 125 mL/hour.  4. Repeat ABG at 5 AM.  5. Transfuse 2 units PRBC now.      Intervention Category Major Interventions: Hypotension - evaluation and management  Rashauna Tep Eugene 09-11-2020, 3:02 AM

## 2020-09-22 NOTE — Progress Notes (Signed)
NAME:  Clayton Wright, MRN:  [id number], DOB:  [birth year], LOS: .1 ADMISSION DATE:  09/10/2020, CONSULTATION DATE:  09/18/2020 REFERRING MD:  Dr. Edilia Bo , CHIEF COMPLAINT:  AAA rupture   Brief History:  60 year old male presents to ED on 3/8 after collapsing in neighborhood. Per EMS patient was initially responsive with report abdominal pain however shortly after become unresponsive with bradypnea requiring assisted ventilations with BVM. On arrival to ED remained unresponsive and hypotensive requiring intubation. CTA A/P with ruptured 7 cm infrarenal abdominal aortic aneurysm and large retroperitoneal hematoma. Taken to OR for repair with bypass graft and placement of abdominal VAC, while in OR with cardiac arrest, given 5 units RBC, 5 units FFP, 1 unit PLT, 1 unit Cryo. Post-operatively transferred to ICU.   Past Medical History:  COPD, Tobacco Use  Significant Hospital Events:  3/8 > Presents to ED, Taken to OR 3/9 > blood products as of 7am on 3/9 = 11 units PRBC, 2 units platelets, 8 units FFP, 3 units cryoprecipitate  Consults:  PCCM Vascular   Procedures:  ETT 3/8 >>  Significant Diagnostic Tests:  CTA A/P 3/8 > 1. Ruptured 7 cm infrarenal abdominal aortic aneurysm, with a rent identified in the right posterolateral aspect of the infrarenal aorta as above. Large retroperitoneal hematoma, right greater than left, with displacement of the right kidney as result. NON-VASCULAR 1. Large retroperitoneal hematoma as above. 2. Trace free fluid within the pelvis.  Micro Data:    Antimicrobials:  3/9 Vancomycin for surgical ppx  Interim History / Subjective:  Clayton Wright is sedated, intubated but will open eyes intermittently   Objective   Blood pressure (!) 84/72, pulse (!) 121, temperature (!) 94.64 F (34.8 C), resp. rate 18, height 5\' 11"  (1.803 m), weight 119.2 kg, SpO2 90 %.    Vent Mode: PRVC FiO2 (%):  [50 %-100 %] 50 % Set Rate:  [12 bmp-16 bmp] 12 bmp Vt Set:  [550  mL-600 mL] 600 mL PEEP:  [5 cmH20] 5 cmH20 Pressure Support:  [10 cmH20] 10 cmH20 Plateau Pressure:  [14 cmH20-18 cmH20] 18 cmH20   Intake/Output Summary (Last 24 hours) at 09-14-2020 0630 Last data filed at 09-14-2020 0620 Gross per 24 hour  Intake 14685.76 ml  Output 5050 ml  Net 9635.76 ml   Filed Weights   09/03/2020 2229 08/27/2020 2245  Weight: 119.2 kg 119.2 kg    Examination: General: Lethargic, Critically ill appearing gentleman on multiple pressors, hemodynamically unstable HENT: ETT in place, pupils equal and reactive to light, NGT in place Lungs: CTABL, no wheezes crackles, rhonchi  Cardiovascular: Tachycardic, no MGR Abdomen: Distended, longitudinal surgical incision in place with bleeding from its borders, wound vac in place Extremities: Warm to touch Neuro: Lethargic, sedated, will move extremities intermittently, unable to assess for strength  GU: Foley catheter in place  Resolved Hospital Problem list     Assessment & Plan:  Ruptured abdominal aortic aneurysm status post repair with wound VAC Hemorrhagic shock Disseminated intravascular coagulation S/p 11 UPRBC, 2 U platelets, 8 U FFP, 3 U cryoprecipitate He remains persistently hypotensive despite being on vasopressin and Levophed.  Currently, his blood pressure is 89/40 with a MAP of 61.  His course has been complicated with DIC with noticeable bleeding from surgical incision site in addition to positive coagulopathy labs.  Recent fibrinogen level was 130 so far, blood products as of 3/9 2022 are 11 units PRBC, 2 units platelets, 8 units FFP, 3 units cryoprecipitate and about  3 L IVF.  He has also received tranexamic acid. -2 L normal saline boluses -Support with blood products as needed -Administer 1 mg of recombinant factor VIIa -Continue pressor support with Levophed and vasopressin -Appreciate vascular surgery recommendations -Follow-up CBC every 6 hours -Low threshold to obtain DIC panel to keep fibrinogen >  100, platelet count >50,000, Hgb >7 -Administer calcium gluconate   Acute respiratory failure secondary to ongoing hemorrhagic shock ABG consistent with metabolic acidosis -Daily SAT & SBT -LTVV, 4-8cc/kg IBW with goal Pplat <30 and DP<15. -VAP prevention protocol -PAD protocol for sedation; Versed, fentanyl   Elevated anion gap metabolic acidosis Acute renal failure Lactic acid has been persistently above 11 due to hypovolemic shock and inability to perfuse vital organs and he is status post several pushes of sodium bicarb and currently remain on intravenous bicarb infusion -Follow BMP -Replace electrolytes as needed -Continue bicarb infusion   Reactive leukocytosis from ongoing hemorrhagic shock -Follow-up CBC   Hyperglycemia  -Even though his goal goal BG 140-180 -SSI  Best practice (evaluated daily)  Diet: NPO Pain/Anxiety/Delirium protocol (if indicated): Fentanyl, Versed VAP protocol (if indicated): Bundle DVT prophylaxis: SCD GI prophylaxis: PPI Glucose control: SSI Mobility: Bedrest  Disposition:Admit to ICU   Goals of Care:  Last date of multidisciplinary goals of care discussion: Discussed with family at bedside and decision was made to transition to DNR Family and staff present: Sister, brother Summary of discussion: Had a candid conversation with the family at bedside given Clayton Wright's grave prognosis and inability to improve despite being on multiple pressors and receiving blood products.  They are very understandable and aware of his situation Follow up goals of care discussion due: 3/16 Code Status: DNR  Labs   CBC: Recent Labs  Lab 09/03/2020 1745 09/18/2020 2249 09/19/2020 2250 09/08/2020 2258 September 23, 2020 0037 2020-09-23 0206 2020-09-23 0242 09/23/2020 0427  WBC 17.9*  --  24.1*  --   --  20.0*  --   --   HGB 12.5*  --  9.6* 8.8* 9.2* 7.2* 5.8* 7.8*  HCT 38.8*  --  29.8* 26.0* 27.0* 21.3* 17.0* 23.0*  MCV 95.6  --  92.3  --   --  91.8  --   --   PLT  188 33* 32*  --   --  63*  64*  --   --     Basic Metabolic Panel: Recent Labs  Lab 09/21/2020 1742 09/03/2020 1745 09/02/2020 2250 09/14/2020 2258 09/23/20 0037 09/23/2020 0206 September 23, 2020 0242 2020/09/23 0427  NA 139 136 136 137 139 137 140 141  K 3.0* 2.9* 5.1 5.0 4.2 4.0 4.0 3.9  CL 103 100 105  --   --  101  --   --   CO2  --  15* 15*  --   --  14*  --   --   GLUCOSE 206* 224* 350*  --   --  361*  --   --   BUN 13 12 13   --   --  14  --   --   CREATININE 1.60* 1.73* 2.12*  --   --  2.44*  --   --   CALCIUM  --  9.2 8.5*  --   --  7.6*  --   --   MG  --   --  2.4  --   --  2.4  --   --    GFR: Estimated Creatinine Clearance: 42.3 mL/min (A) (by C-G formula based on SCr of  2.44 mg/dL (H)). Recent Labs  Lab 09/16/2020 1745 09/03/2020 1746 08/23/2020 2250 Sep 25, 2020 0039 09-25-20 0206  WBC 17.9*  --  24.1*  --  20.0*  LATICACIDVEN  --  >11.0*  --  >11.0* >11.0*    Liver Function Tests: Recent Labs  Lab 08/28/2020 1745 Sep 25, 2020 0206  AST 25 484*  ALT 19 495*  ALKPHOS 55 68  BILITOT 0.9 1.2  PROT 6.2* 3.2*  ALBUMIN 3.6 1.9*   Recent Labs  Lab 25-Sep-2020 0206  AMYLASE 507*   No results for input(s): AMMONIA in the last 168 hours.  ABG    Component Value Date/Time   PHART 7.188 (LL) 2020-09-25 0427   PCO2ART 37.2 2020-09-25 0427   PO2ART 152 (H) 09-25-2020 0427   HCO3 14.7 (L) Sep 25, 2020 0427   TCO2 16 (L) 09-25-20 0427   ACIDBASEDEF 13.0 (H) 2020-09-25 0427   O2SAT 99.0 09/25/20 0427     Coagulation Profile: Recent Labs  Lab 09/03/2020 1745 08/27/2020 2249 2020-09-25 0206  INR 1.2 2.8* 2.7*    Cardiac Enzymes: No results for input(s): CKTOTAL, CKMB, CKMBINDEX, TROPONINI in the last 168 hours.  HbA1C: Hgb A1c MFr Bld  Date/Time Value Ref Range Status  2020/09/25 12:31 AM 5.7 (H) 4.8 - 5.6 % Final    Comment:    (NOTE) Pre diabetes:          5.7%-6.4%  Diabetes:              >6.4%  Glycemic control for   <7.0% adults with diabetes     CBG: Recent Labs   Lab 09-25-2020 0424  GLUCAP 308*    Review of Systems:   As per HPI  Past Medical History:  He,  has a past medical history of COPD (chronic obstructive pulmonary disease) (HCC) and Pneumonia.   Surgical History:  No past surgical history on file.   Social History:   reports that he has been smoking cigarettes. He has been smoking about 1.00 pack per day. He does not have any smokeless tobacco history on file. He reports that he does not drink alcohol and does not use drugs.   Family History:  His family history is not on file.   Allergies Allergies  Allergen Reactions  . Amoxicillin   . Bee Venom      Home Medications  Prior to Admission medications   Medication Sig Start Date End Date Taking? Authorizing Provider  azithromycin (ZITHROMAX) 250 MG tablet Take 1 tablet (250 mg total) by mouth daily. Take first 2 tablets together, then 1 every day until finished. 06/13/13   Rodolph Bong, MD  HYDROcodone-acetaminophen (NORCO/VICODIN) 5-325 MG per tablet Take 1 tablet by mouth every 6 (six) hours as needed. 06/13/13   Rodolph Bong, MD  ondansetron (ZOFRAN) 8 MG tablet Take 1 tablet (8 mg total) by mouth every 8 (eight) hours as needed for nausea or vomiting. 06/13/13   Rodolph Bong, MD     Critical care time:

## 2020-09-22 DEATH — deceased

## 2021-11-06 IMAGING — DX DG CHEST 1V PORT
1 series · 1 of 1 positions shown · non-contrast
Comparison: None.

CLINICAL DATA: Status post abdominal aortic repair

EXAM:
PORTABLE CHEST 1 VIEW

[chest]
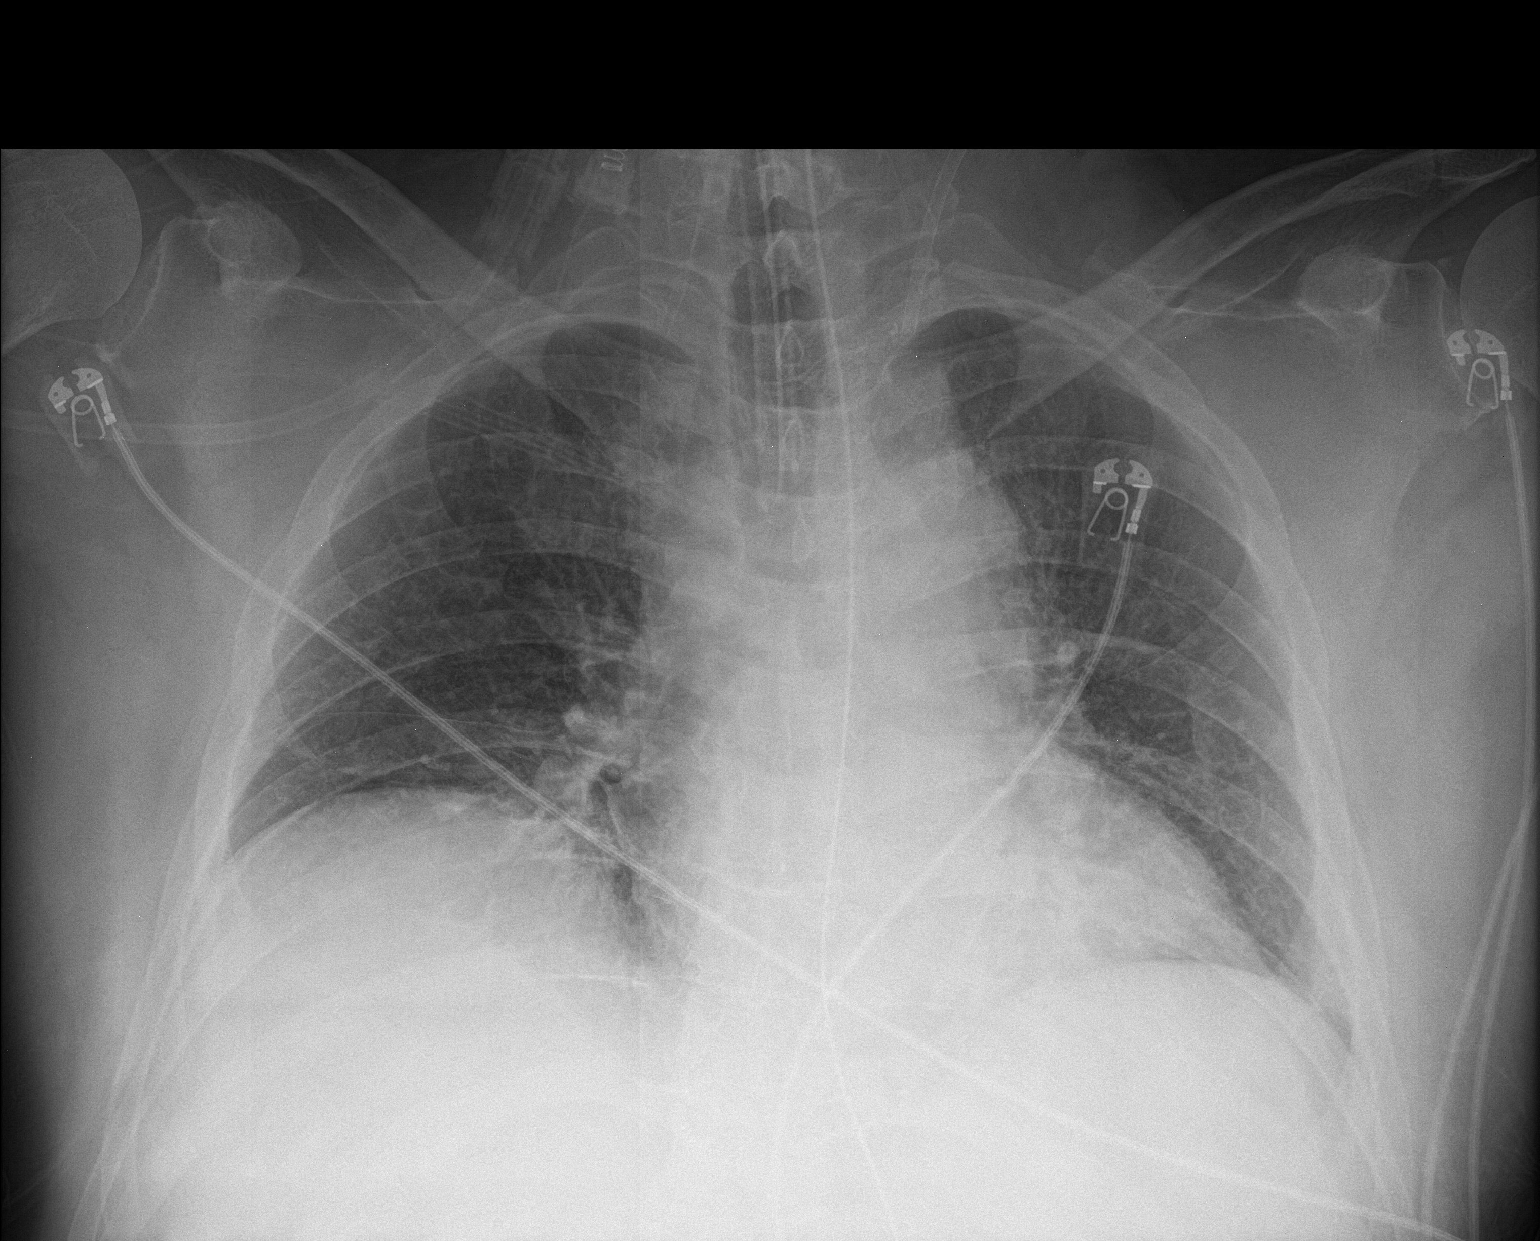

[1 of 1 positions shown; findings below may reference images not displayed]

FINDINGS: Cardiac shadow is within normal limits. Endotracheal tube and
gastric catheter are noted in satisfactory position. Elevation the
right hemidiaphragm is seen. The lungs are clear. Jugular sheath is
noted on the left
IMPRESSION: No acute abnormality noted.

Tubes and lines as described.

## 2021-11-06 IMAGING — DX DG OR LOCAL ABDOMEN
1 series · 1 of 1 positions shown · non-contrast
Comparison: CT 08/29/2020

CLINICAL DATA: Operative count

EXAM:
OR LOCAL ABDOMEN

[chest ap]
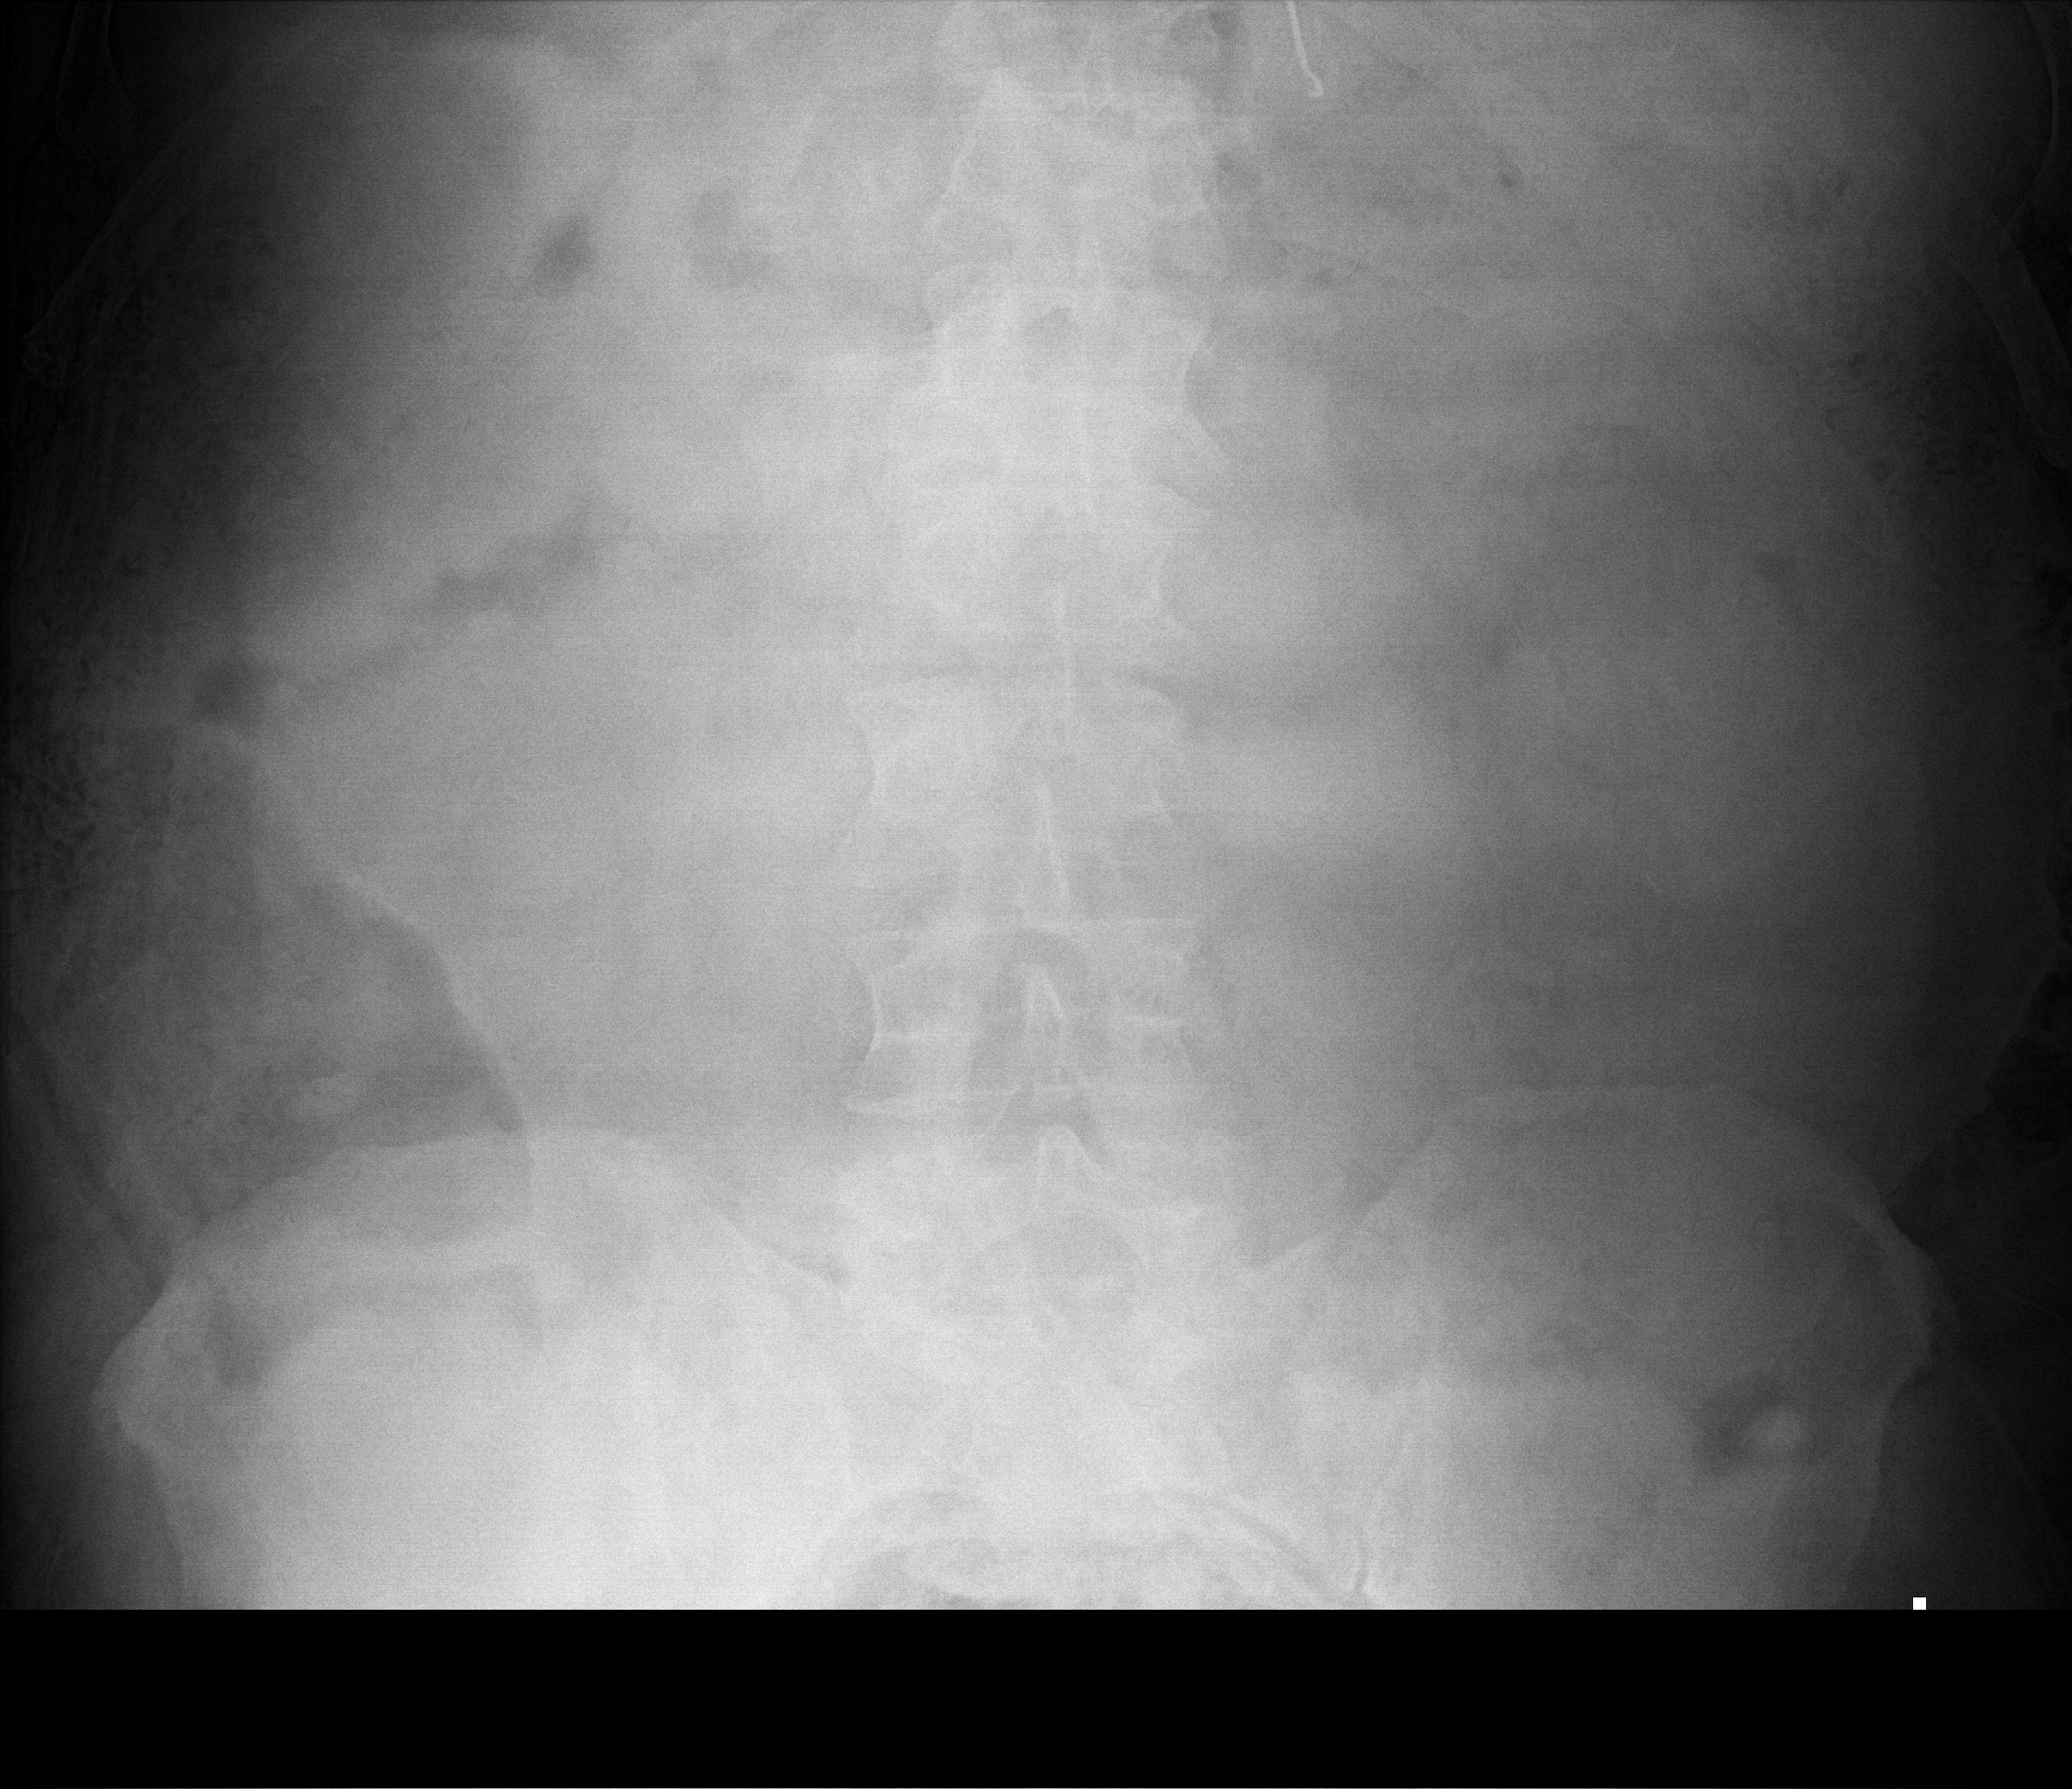

[1 of 1 positions shown; findings below may reference images not displayed]

FINDINGS: Catheter tubing coursing from the lower margin of imaging to the
patient's midline likely reflecting a wound VAC. Tip of a
transesophageal tube projects over the left upper quadrant. Surgical
appearance of the abdomen is noted. No unexpected radiopaque foreign
body or other concerning abnormality.
IMPRESSION: No unexpected radiopaque foreign body.

These results were called by telephone to the operating room at the
[facility], who verbally acknowledged these results and will relay the
findings to the operative provider.

## 2021-11-07 IMAGING — DX DG CHEST 1V PORT
1 series · 1 of 1 positions shown · non-contrast
Comparison: 08/29/2020.

CLINICAL DATA: Intubation.  Status post ruptured AAA repair.

EXAM:
PORTABLE CHEST 1 VIEW

[chest ap]
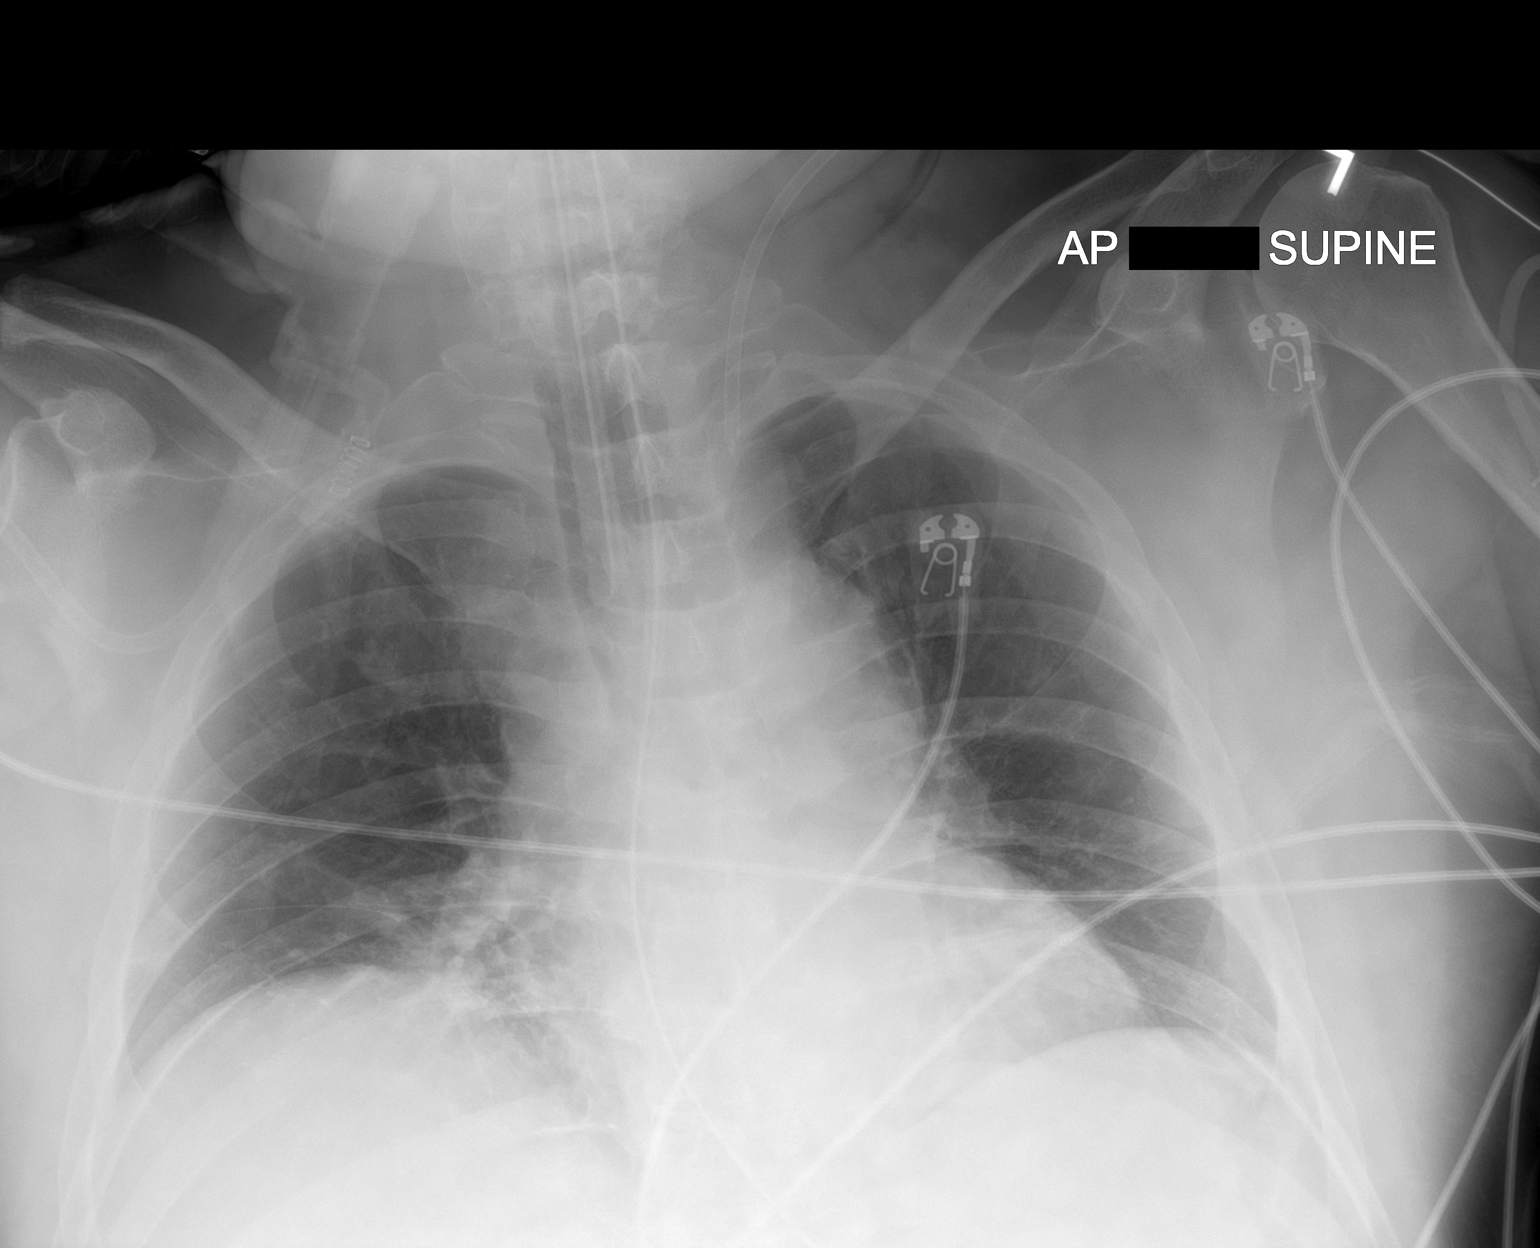

[1 of 1 positions shown; findings below may reference images not displayed]

FINDINGS: Endotracheal tube, NG tube, left IJ sheath in stable position. Heart
size stable bibasilar atelectasis noted. Low lung volumes with mild
bibasilar infiltrates cannot be excluded. No pleural effusion or
pneumothorax.
IMPRESSION: 1. Lines and tubes in stable position.
2. Low lung volumes with bibasilar atelectasis. Mild bibasilar
infiltrates cannot be excluded.
